# Patient Record
Sex: Male | Born: 1935 | Race: White | Hispanic: No | Smoking: Former smoker
Health system: Southern US, Community
[De-identification: ages and names within clinical notes are randomized; demographics above are authoritative.]

---

## 2014-03-06 DIAGNOSIS — N401 Enlarged prostate with lower urinary tract symptoms: Secondary | ICD-10-CM | POA: Diagnosis not present

## 2014-03-06 DIAGNOSIS — R339 Retention of urine, unspecified: Secondary | ICD-10-CM | POA: Diagnosis not present

## 2014-04-03 DIAGNOSIS — M25511 Pain in right shoulder: Secondary | ICD-10-CM | POA: Diagnosis not present

## 2014-04-03 DIAGNOSIS — Z79899 Other long term (current) drug therapy: Secondary | ICD-10-CM | POA: Diagnosis not present

## 2014-04-03 DIAGNOSIS — Z6830 Body mass index (BMI) 30.0-30.9, adult: Secondary | ICD-10-CM | POA: Diagnosis not present

## 2014-04-03 DIAGNOSIS — E785 Hyperlipidemia, unspecified: Secondary | ICD-10-CM | POA: Diagnosis not present

## 2014-04-03 DIAGNOSIS — Z0000 Encounter for general adult medical examination without abnormal findings: Secondary | ICD-10-CM | POA: Diagnosis not present

## 2014-04-03 DIAGNOSIS — N401 Enlarged prostate with lower urinary tract symptoms: Secondary | ICD-10-CM | POA: Diagnosis not present

## 2014-04-03 DIAGNOSIS — Z125 Encounter for screening for malignant neoplasm of prostate: Secondary | ICD-10-CM | POA: Diagnosis not present

## 2014-04-03 DIAGNOSIS — Z136 Encounter for screening for cardiovascular disorders: Secondary | ICD-10-CM | POA: Diagnosis not present

## 2014-04-03 DIAGNOSIS — Z1211 Encounter for screening for malignant neoplasm of colon: Secondary | ICD-10-CM | POA: Diagnosis not present

## 2014-04-03 DIAGNOSIS — E038 Other specified hypothyroidism: Secondary | ICD-10-CM | POA: Diagnosis not present

## 2014-04-03 DIAGNOSIS — M25512 Pain in left shoulder: Secondary | ICD-10-CM | POA: Diagnosis not present

## 2014-04-03 DIAGNOSIS — I10 Essential (primary) hypertension: Secondary | ICD-10-CM | POA: Diagnosis not present

## 2014-04-16 DIAGNOSIS — Z1211 Encounter for screening for malignant neoplasm of colon: Secondary | ICD-10-CM | POA: Diagnosis not present

## 2014-05-02 DIAGNOSIS — N401 Enlarged prostate with lower urinary tract symptoms: Secondary | ICD-10-CM | POA: Diagnosis not present

## 2014-05-02 DIAGNOSIS — Z79899 Other long term (current) drug therapy: Secondary | ICD-10-CM | POA: Diagnosis not present

## 2014-05-02 DIAGNOSIS — I10 Essential (primary) hypertension: Secondary | ICD-10-CM | POA: Diagnosis not present

## 2014-06-04 DIAGNOSIS — N401 Enlarged prostate with lower urinary tract symptoms: Secondary | ICD-10-CM | POA: Diagnosis not present

## 2014-06-04 DIAGNOSIS — Z79899 Other long term (current) drug therapy: Secondary | ICD-10-CM | POA: Diagnosis not present

## 2014-06-04 DIAGNOSIS — I10 Essential (primary) hypertension: Secondary | ICD-10-CM | POA: Diagnosis not present

## 2014-06-04 DIAGNOSIS — R58 Hemorrhage, not elsewhere classified: Secondary | ICD-10-CM | POA: Diagnosis not present

## 2014-06-05 DIAGNOSIS — N401 Enlarged prostate with lower urinary tract symptoms: Secondary | ICD-10-CM | POA: Diagnosis not present

## 2014-06-05 DIAGNOSIS — R972 Elevated prostate specific antigen [PSA]: Secondary | ICD-10-CM | POA: Diagnosis not present

## 2014-06-27 DIAGNOSIS — K120 Recurrent oral aphthae: Secondary | ICD-10-CM | POA: Diagnosis not present

## 2014-09-10 DIAGNOSIS — R339 Retention of urine, unspecified: Secondary | ICD-10-CM | POA: Diagnosis not present

## 2014-09-10 DIAGNOSIS — N401 Enlarged prostate with lower urinary tract symptoms: Secondary | ICD-10-CM | POA: Diagnosis not present

## 2014-09-10 DIAGNOSIS — R972 Elevated prostate specific antigen [PSA]: Secondary | ICD-10-CM | POA: Diagnosis not present

## 2014-10-02 DIAGNOSIS — N401 Enlarged prostate with lower urinary tract symptoms: Secondary | ICD-10-CM | POA: Diagnosis not present

## 2014-10-02 DIAGNOSIS — I10 Essential (primary) hypertension: Secondary | ICD-10-CM | POA: Diagnosis not present

## 2014-10-02 DIAGNOSIS — E785 Hyperlipidemia, unspecified: Secondary | ICD-10-CM | POA: Diagnosis not present

## 2014-10-02 DIAGNOSIS — L219 Seborrheic dermatitis, unspecified: Secondary | ICD-10-CM | POA: Diagnosis not present

## 2014-10-02 DIAGNOSIS — E038 Other specified hypothyroidism: Secondary | ICD-10-CM | POA: Diagnosis not present

## 2014-10-02 DIAGNOSIS — Z79899 Other long term (current) drug therapy: Secondary | ICD-10-CM | POA: Diagnosis not present

## 2014-12-17 DIAGNOSIS — Z23 Encounter for immunization: Secondary | ICD-10-CM | POA: Diagnosis not present

## 2014-12-18 DIAGNOSIS — R972 Elevated prostate specific antigen [PSA]: Secondary | ICD-10-CM | POA: Diagnosis not present

## 2014-12-18 DIAGNOSIS — N401 Enlarged prostate with lower urinary tract symptoms: Secondary | ICD-10-CM | POA: Diagnosis not present

## 2014-12-18 DIAGNOSIS — R339 Retention of urine, unspecified: Secondary | ICD-10-CM | POA: Diagnosis not present

## 2015-03-18 DIAGNOSIS — Z961 Presence of intraocular lens: Secondary | ICD-10-CM | POA: Diagnosis not present

## 2015-03-18 DIAGNOSIS — H18412 Arcus senilis, left eye: Secondary | ICD-10-CM | POA: Diagnosis not present

## 2015-03-18 DIAGNOSIS — H18411 Arcus senilis, right eye: Secondary | ICD-10-CM | POA: Diagnosis not present

## 2015-03-18 DIAGNOSIS — H2512 Age-related nuclear cataract, left eye: Secondary | ICD-10-CM | POA: Diagnosis not present

## 2015-03-18 DIAGNOSIS — H02839 Dermatochalasis of unspecified eye, unspecified eyelid: Secondary | ICD-10-CM | POA: Diagnosis not present

## 2015-03-24 DIAGNOSIS — H25812 Combined forms of age-related cataract, left eye: Secondary | ICD-10-CM | POA: Diagnosis not present

## 2015-03-24 DIAGNOSIS — H2512 Age-related nuclear cataract, left eye: Secondary | ICD-10-CM | POA: Diagnosis not present

## 2015-04-16 DIAGNOSIS — R339 Retention of urine, unspecified: Secondary | ICD-10-CM | POA: Diagnosis not present

## 2015-04-16 DIAGNOSIS — N401 Enlarged prostate with lower urinary tract symptoms: Secondary | ICD-10-CM | POA: Diagnosis not present

## 2015-04-16 DIAGNOSIS — R972 Elevated prostate specific antigen [PSA]: Secondary | ICD-10-CM | POA: Diagnosis not present

## 2015-08-13 DIAGNOSIS — Z136 Encounter for screening for cardiovascular disorders: Secondary | ICD-10-CM | POA: Diagnosis not present

## 2015-08-13 DIAGNOSIS — Z79899 Other long term (current) drug therapy: Secondary | ICD-10-CM | POA: Diagnosis not present

## 2015-08-13 DIAGNOSIS — E669 Obesity, unspecified: Secondary | ICD-10-CM | POA: Diagnosis not present

## 2015-08-13 DIAGNOSIS — Z125 Encounter for screening for malignant neoplasm of prostate: Secondary | ICD-10-CM | POA: Diagnosis not present

## 2015-08-13 DIAGNOSIS — I10 Essential (primary) hypertension: Secondary | ICD-10-CM | POA: Diagnosis not present

## 2015-08-13 DIAGNOSIS — Z9181 History of falling: Secondary | ICD-10-CM | POA: Diagnosis not present

## 2015-08-13 DIAGNOSIS — Z1211 Encounter for screening for malignant neoplasm of colon: Secondary | ICD-10-CM | POA: Diagnosis not present

## 2015-08-13 DIAGNOSIS — Z1389 Encounter for screening for other disorder: Secondary | ICD-10-CM | POA: Diagnosis not present

## 2015-08-13 DIAGNOSIS — E782 Mixed hyperlipidemia: Secondary | ICD-10-CM | POA: Diagnosis not present

## 2015-08-13 DIAGNOSIS — Z0000 Encounter for general adult medical examination without abnormal findings: Secondary | ICD-10-CM | POA: Diagnosis not present

## 2015-08-13 DIAGNOSIS — E039 Hypothyroidism, unspecified: Secondary | ICD-10-CM | POA: Diagnosis not present

## 2015-08-13 DIAGNOSIS — N401 Enlarged prostate with lower urinary tract symptoms: Secondary | ICD-10-CM | POA: Diagnosis not present

## 2015-08-13 DIAGNOSIS — R011 Cardiac murmur, unspecified: Secondary | ICD-10-CM | POA: Diagnosis not present

## 2015-08-15 DIAGNOSIS — N401 Enlarged prostate with lower urinary tract symptoms: Secondary | ICD-10-CM | POA: Diagnosis not present

## 2015-08-15 DIAGNOSIS — R339 Retention of urine, unspecified: Secondary | ICD-10-CM | POA: Diagnosis not present

## 2015-08-18 DIAGNOSIS — R011 Cardiac murmur, unspecified: Secondary | ICD-10-CM | POA: Diagnosis not present

## 2015-08-18 DIAGNOSIS — I350 Nonrheumatic aortic (valve) stenosis: Secondary | ICD-10-CM | POA: Diagnosis not present

## 2015-08-18 DIAGNOSIS — I082 Rheumatic disorders of both aortic and tricuspid valves: Secondary | ICD-10-CM | POA: Diagnosis not present

## 2015-11-18 DIAGNOSIS — Z23 Encounter for immunization: Secondary | ICD-10-CM | POA: Diagnosis not present

## 2015-12-15 DIAGNOSIS — R339 Retention of urine, unspecified: Secondary | ICD-10-CM | POA: Diagnosis not present

## 2015-12-15 DIAGNOSIS — R972 Elevated prostate specific antigen [PSA]: Secondary | ICD-10-CM | POA: Diagnosis not present

## 2015-12-15 DIAGNOSIS — N401 Enlarged prostate with lower urinary tract symptoms: Secondary | ICD-10-CM | POA: Diagnosis not present

## 2016-02-17 DIAGNOSIS — E782 Mixed hyperlipidemia: Secondary | ICD-10-CM | POA: Diagnosis not present

## 2016-02-17 DIAGNOSIS — I10 Essential (primary) hypertension: Secondary | ICD-10-CM | POA: Diagnosis not present

## 2016-02-17 DIAGNOSIS — F439 Reaction to severe stress, unspecified: Secondary | ICD-10-CM | POA: Diagnosis not present

## 2016-02-17 DIAGNOSIS — Z79899 Other long term (current) drug therapy: Secondary | ICD-10-CM | POA: Diagnosis not present

## 2016-02-17 DIAGNOSIS — E039 Hypothyroidism, unspecified: Secondary | ICD-10-CM | POA: Diagnosis not present

## 2016-02-17 DIAGNOSIS — R42 Dizziness and giddiness: Secondary | ICD-10-CM | POA: Diagnosis not present

## 2016-03-30 DIAGNOSIS — Z79899 Other long term (current) drug therapy: Secondary | ICD-10-CM | POA: Diagnosis not present

## 2016-03-30 DIAGNOSIS — F419 Anxiety disorder, unspecified: Secondary | ICD-10-CM | POA: Diagnosis not present

## 2016-03-30 DIAGNOSIS — R42 Dizziness and giddiness: Secondary | ICD-10-CM | POA: Diagnosis not present

## 2016-03-30 DIAGNOSIS — R51 Headache: Secondary | ICD-10-CM | POA: Diagnosis not present

## 2016-04-20 DIAGNOSIS — N401 Enlarged prostate with lower urinary tract symptoms: Secondary | ICD-10-CM | POA: Diagnosis not present

## 2016-04-20 DIAGNOSIS — R339 Retention of urine, unspecified: Secondary | ICD-10-CM | POA: Diagnosis not present

## 2016-04-20 DIAGNOSIS — R972 Elevated prostate specific antigen [PSA]: Secondary | ICD-10-CM | POA: Diagnosis not present

## 2016-08-18 DIAGNOSIS — R972 Elevated prostate specific antigen [PSA]: Secondary | ICD-10-CM | POA: Diagnosis not present

## 2016-08-18 DIAGNOSIS — N401 Enlarged prostate with lower urinary tract symptoms: Secondary | ICD-10-CM | POA: Diagnosis not present

## 2016-09-23 DIAGNOSIS — I10 Essential (primary) hypertension: Secondary | ICD-10-CM | POA: Diagnosis not present

## 2016-09-23 DIAGNOSIS — E785 Hyperlipidemia, unspecified: Secondary | ICD-10-CM | POA: Diagnosis not present

## 2016-09-23 DIAGNOSIS — Z1211 Encounter for screening for malignant neoplasm of colon: Secondary | ICD-10-CM | POA: Diagnosis not present

## 2016-09-23 DIAGNOSIS — Z0000 Encounter for general adult medical examination without abnormal findings: Secondary | ICD-10-CM | POA: Diagnosis not present

## 2016-09-23 DIAGNOSIS — Z136 Encounter for screening for cardiovascular disorders: Secondary | ICD-10-CM | POA: Diagnosis not present

## 2016-10-11 DIAGNOSIS — E039 Hypothyroidism, unspecified: Secondary | ICD-10-CM | POA: Diagnosis not present

## 2016-10-11 DIAGNOSIS — Z136 Encounter for screening for cardiovascular disorders: Secondary | ICD-10-CM | POA: Diagnosis not present

## 2016-10-11 DIAGNOSIS — Z79899 Other long term (current) drug therapy: Secondary | ICD-10-CM | POA: Diagnosis not present

## 2016-10-11 DIAGNOSIS — I10 Essential (primary) hypertension: Secondary | ICD-10-CM | POA: Diagnosis not present

## 2016-10-11 DIAGNOSIS — E782 Mixed hyperlipidemia: Secondary | ICD-10-CM | POA: Diagnosis not present

## 2016-11-30 DIAGNOSIS — Z23 Encounter for immunization: Secondary | ICD-10-CM | POA: Diagnosis not present

## 2016-12-21 DIAGNOSIS — R972 Elevated prostate specific antigen [PSA]: Secondary | ICD-10-CM | POA: Diagnosis not present

## 2016-12-21 DIAGNOSIS — R339 Retention of urine, unspecified: Secondary | ICD-10-CM | POA: Diagnosis not present

## 2016-12-21 DIAGNOSIS — N401 Enlarged prostate with lower urinary tract symptoms: Secondary | ICD-10-CM | POA: Diagnosis not present

## 2017-04-13 DIAGNOSIS — S6992XA Unspecified injury of left wrist, hand and finger(s), initial encounter: Secondary | ICD-10-CM | POA: Diagnosis not present

## 2017-04-13 DIAGNOSIS — I10 Essential (primary) hypertension: Secondary | ICD-10-CM | POA: Diagnosis not present

## 2017-04-13 DIAGNOSIS — Z79899 Other long term (current) drug therapy: Secondary | ICD-10-CM | POA: Diagnosis not present

## 2017-04-13 DIAGNOSIS — D692 Other nonthrombocytopenic purpura: Secondary | ICD-10-CM | POA: Diagnosis not present

## 2017-04-13 DIAGNOSIS — E039 Hypothyroidism, unspecified: Secondary | ICD-10-CM | POA: Diagnosis not present

## 2017-04-13 DIAGNOSIS — E782 Mixed hyperlipidemia: Secondary | ICD-10-CM | POA: Diagnosis not present

## 2017-04-28 DIAGNOSIS — R972 Elevated prostate specific antigen [PSA]: Secondary | ICD-10-CM | POA: Diagnosis not present

## 2017-04-28 DIAGNOSIS — N401 Enlarged prostate with lower urinary tract symptoms: Secondary | ICD-10-CM | POA: Diagnosis not present

## 2017-04-28 DIAGNOSIS — R339 Retention of urine, unspecified: Secondary | ICD-10-CM | POA: Diagnosis not present

## 2017-07-25 DIAGNOSIS — E039 Hypothyroidism, unspecified: Secondary | ICD-10-CM | POA: Diagnosis not present

## 2017-07-25 DIAGNOSIS — I10 Essential (primary) hypertension: Secondary | ICD-10-CM | POA: Diagnosis not present

## 2017-07-25 DIAGNOSIS — R531 Weakness: Secondary | ICD-10-CM | POA: Diagnosis not present

## 2017-07-25 DIAGNOSIS — E785 Hyperlipidemia, unspecified: Secondary | ICD-10-CM | POA: Diagnosis not present

## 2017-07-25 DIAGNOSIS — R42 Dizziness and giddiness: Secondary | ICD-10-CM | POA: Diagnosis not present

## 2017-08-02 DIAGNOSIS — R531 Weakness: Secondary | ICD-10-CM | POA: Diagnosis not present

## 2017-08-02 DIAGNOSIS — R42 Dizziness and giddiness: Secondary | ICD-10-CM | POA: Diagnosis not present

## 2017-08-02 DIAGNOSIS — I350 Nonrheumatic aortic (valve) stenosis: Secondary | ICD-10-CM | POA: Diagnosis not present

## 2017-08-02 DIAGNOSIS — D696 Thrombocytopenia, unspecified: Secondary | ICD-10-CM | POA: Diagnosis not present

## 2017-08-29 DIAGNOSIS — N401 Enlarged prostate with lower urinary tract symptoms: Secondary | ICD-10-CM | POA: Diagnosis not present

## 2017-08-29 DIAGNOSIS — R972 Elevated prostate specific antigen [PSA]: Secondary | ICD-10-CM | POA: Diagnosis not present

## 2017-10-18 DIAGNOSIS — Z9181 History of falling: Secondary | ICD-10-CM | POA: Diagnosis not present

## 2017-10-18 DIAGNOSIS — Z0000 Encounter for general adult medical examination without abnormal findings: Secondary | ICD-10-CM | POA: Diagnosis not present

## 2017-10-18 DIAGNOSIS — Z136 Encounter for screening for cardiovascular disorders: Secondary | ICD-10-CM | POA: Diagnosis not present

## 2017-10-18 DIAGNOSIS — Z1331 Encounter for screening for depression: Secondary | ICD-10-CM | POA: Diagnosis not present

## 2017-10-18 DIAGNOSIS — E785 Hyperlipidemia, unspecified: Secondary | ICD-10-CM | POA: Diagnosis not present

## 2017-10-18 DIAGNOSIS — Z23 Encounter for immunization: Secondary | ICD-10-CM | POA: Diagnosis not present

## 2017-10-18 DIAGNOSIS — Z125 Encounter for screening for malignant neoplasm of prostate: Secondary | ICD-10-CM | POA: Diagnosis not present

## 2017-12-23 DIAGNOSIS — Z23 Encounter for immunization: Secondary | ICD-10-CM | POA: Diagnosis not present

## 2018-01-04 DIAGNOSIS — N401 Enlarged prostate with lower urinary tract symptoms: Secondary | ICD-10-CM | POA: Diagnosis not present

## 2018-01-04 DIAGNOSIS — R972 Elevated prostate specific antigen [PSA]: Secondary | ICD-10-CM | POA: Diagnosis not present

## 2018-05-05 DIAGNOSIS — N401 Enlarged prostate with lower urinary tract symptoms: Secondary | ICD-10-CM | POA: Diagnosis not present

## 2018-05-05 DIAGNOSIS — R339 Retention of urine, unspecified: Secondary | ICD-10-CM | POA: Diagnosis not present

## 2018-06-07 DIAGNOSIS — E039 Hypothyroidism, unspecified: Secondary | ICD-10-CM | POA: Diagnosis not present

## 2018-06-07 DIAGNOSIS — I10 Essential (primary) hypertension: Secondary | ICD-10-CM | POA: Diagnosis not present

## 2018-06-07 DIAGNOSIS — E785 Hyperlipidemia, unspecified: Secondary | ICD-10-CM | POA: Diagnosis not present

## 2018-06-07 DIAGNOSIS — Z79899 Other long term (current) drug therapy: Secondary | ICD-10-CM | POA: Diagnosis not present

## 2018-06-13 DIAGNOSIS — E785 Hyperlipidemia, unspecified: Secondary | ICD-10-CM | POA: Diagnosis not present

## 2018-06-13 DIAGNOSIS — E039 Hypothyroidism, unspecified: Secondary | ICD-10-CM | POA: Diagnosis not present

## 2018-09-04 DIAGNOSIS — R972 Elevated prostate specific antigen [PSA]: Secondary | ICD-10-CM | POA: Diagnosis not present

## 2018-09-04 DIAGNOSIS — R339 Retention of urine, unspecified: Secondary | ICD-10-CM | POA: Diagnosis not present

## 2018-09-04 DIAGNOSIS — N401 Enlarged prostate with lower urinary tract symptoms: Secondary | ICD-10-CM | POA: Diagnosis not present

## 2018-10-23 DIAGNOSIS — Z9181 History of falling: Secondary | ICD-10-CM | POA: Diagnosis not present

## 2018-10-23 DIAGNOSIS — Z139 Encounter for screening, unspecified: Secondary | ICD-10-CM | POA: Diagnosis not present

## 2018-10-23 DIAGNOSIS — Z0000 Encounter for general adult medical examination without abnormal findings: Secondary | ICD-10-CM | POA: Diagnosis not present

## 2018-10-23 DIAGNOSIS — Z1331 Encounter for screening for depression: Secondary | ICD-10-CM | POA: Diagnosis not present

## 2018-10-23 DIAGNOSIS — E785 Hyperlipidemia, unspecified: Secondary | ICD-10-CM | POA: Diagnosis not present

## 2018-12-26 DIAGNOSIS — Z23 Encounter for immunization: Secondary | ICD-10-CM | POA: Diagnosis not present

## 2019-01-08 DIAGNOSIS — Z20828 Contact with and (suspected) exposure to other viral communicable diseases: Secondary | ICD-10-CM | POA: Diagnosis not present

## 2019-01-08 DIAGNOSIS — R05 Cough: Secondary | ICD-10-CM | POA: Diagnosis not present

## 2019-01-09 ENCOUNTER — Other Ambulatory Visit: Payer: Self-pay

## 2019-01-09 ENCOUNTER — Inpatient Hospital Stay (HOSPITAL_COMMUNITY)
Admission: AD | Admit: 2019-01-09 | Discharge: 2019-01-23 | DRG: 208 | Disposition: E | Payer: Medicare Other | Source: Other Acute Inpatient Hospital | Attending: Pulmonary Disease | Admitting: Pulmonary Disease

## 2019-01-09 ENCOUNTER — Encounter (HOSPITAL_COMMUNITY): Payer: Self-pay

## 2019-01-09 DIAGNOSIS — W06XXXA Fall from bed, initial encounter: Secondary | ICD-10-CM | POA: Diagnosis not present

## 2019-01-09 DIAGNOSIS — L89221 Pressure ulcer of left hip, stage 1: Secondary | ICD-10-CM | POA: Diagnosis present

## 2019-01-09 DIAGNOSIS — Z515 Encounter for palliative care: Secondary | ICD-10-CM | POA: Diagnosis present

## 2019-01-09 DIAGNOSIS — D6959 Other secondary thrombocytopenia: Secondary | ICD-10-CM | POA: Diagnosis present

## 2019-01-09 DIAGNOSIS — I824Z3 Acute embolism and thrombosis of unspecified deep veins of distal lower extremity, bilateral: Secondary | ICD-10-CM | POA: Diagnosis present

## 2019-01-09 DIAGNOSIS — Z79890 Hormone replacement therapy (postmenopausal): Secondary | ICD-10-CM

## 2019-01-09 DIAGNOSIS — J1289 Other viral pneumonia: Secondary | ICD-10-CM | POA: Diagnosis present

## 2019-01-09 DIAGNOSIS — J80 Acute respiratory distress syndrome: Secondary | ICD-10-CM | POA: Diagnosis not present

## 2019-01-09 DIAGNOSIS — Z87891 Personal history of nicotine dependence: Secondary | ICD-10-CM

## 2019-01-09 DIAGNOSIS — J9600 Acute respiratory failure, unspecified whether with hypoxia or hypercapnia: Secondary | ICD-10-CM | POA: Diagnosis not present

## 2019-01-09 DIAGNOSIS — E039 Hypothyroidism, unspecified: Secondary | ICD-10-CM | POA: Diagnosis present

## 2019-01-09 DIAGNOSIS — J9601 Acute respiratory failure with hypoxia: Secondary | ICD-10-CM | POA: Diagnosis present

## 2019-01-09 DIAGNOSIS — R0902 Hypoxemia: Secondary | ICD-10-CM

## 2019-01-09 DIAGNOSIS — I10 Essential (primary) hypertension: Secondary | ICD-10-CM | POA: Diagnosis not present

## 2019-01-09 DIAGNOSIS — J9383 Other pneumothorax: Secondary | ICD-10-CM | POA: Diagnosis not present

## 2019-01-09 DIAGNOSIS — R778 Other specified abnormalities of plasma proteins: Secondary | ICD-10-CM | POA: Diagnosis not present

## 2019-01-09 DIAGNOSIS — Z66 Do not resuscitate: Secondary | ICD-10-CM | POA: Diagnosis present

## 2019-01-09 DIAGNOSIS — Y92230 Patient room in hospital as the place of occurrence of the external cause: Secondary | ICD-10-CM | POA: Diagnosis not present

## 2019-01-09 DIAGNOSIS — J9690 Respiratory failure, unspecified, unspecified whether with hypoxia or hypercapnia: Secondary | ICD-10-CM | POA: Diagnosis not present

## 2019-01-09 DIAGNOSIS — R7989 Other specified abnormal findings of blood chemistry: Secondary | ICD-10-CM | POA: Diagnosis not present

## 2019-01-09 DIAGNOSIS — I82443 Acute embolism and thrombosis of tibial vein, bilateral: Secondary | ICD-10-CM | POA: Diagnosis present

## 2019-01-09 DIAGNOSIS — J1282 Pneumonia due to coronavirus disease 2019: Secondary | ICD-10-CM | POA: Diagnosis present

## 2019-01-09 DIAGNOSIS — U071 COVID-19: Secondary | ICD-10-CM | POA: Diagnosis present

## 2019-01-09 DIAGNOSIS — J189 Pneumonia, unspecified organism: Secondary | ICD-10-CM | POA: Diagnosis not present

## 2019-01-09 DIAGNOSIS — Z01818 Encounter for other preprocedural examination: Secondary | ICD-10-CM

## 2019-01-09 DIAGNOSIS — R079 Chest pain, unspecified: Secondary | ICD-10-CM | POA: Diagnosis not present

## 2019-01-09 DIAGNOSIS — E785 Hyperlipidemia, unspecified: Secondary | ICD-10-CM | POA: Diagnosis present

## 2019-01-09 DIAGNOSIS — N179 Acute kidney failure, unspecified: Secondary | ICD-10-CM | POA: Diagnosis not present

## 2019-01-09 DIAGNOSIS — L8990 Pressure ulcer of unspecified site, unspecified stage: Secondary | ICD-10-CM | POA: Insufficient documentation

## 2019-01-09 DIAGNOSIS — N400 Enlarged prostate without lower urinary tract symptoms: Secondary | ICD-10-CM | POA: Diagnosis not present

## 2019-01-09 MED ORDER — DEXAMETHASONE SODIUM PHOSPHATE 10 MG/ML IJ SOLN
6.0000 mg | INTRAMUSCULAR | Status: DC
  Administered 2019-01-09 – 2019-01-15 (×7): 6 mg via INTRAVENOUS
  Filled 2019-01-09 (×7): qty 1

## 2019-01-09 MED ORDER — ONDANSETRON HCL 4 MG PO TABS: 4.0000 mg | ORAL_TABLET | Freq: Four times a day (QID) | ORAL | Status: DC | PRN

## 2019-01-09 MED ORDER — ZINC SULFATE 220 (50 ZN) MG PO CAPS
220.0000 mg | ORAL_CAPSULE | Freq: Every day | ORAL | Status: DC
  Administered 2019-01-09 – 2019-01-15 (×7): 220 mg via ORAL
  Filled 2019-01-09 (×8): qty 1

## 2019-01-09 MED ORDER — SODIUM CHLORIDE 0.9 % IV SOLN
100.0000 mg | INTRAVENOUS | Status: AC
  Administered 2019-01-10 – 2019-01-13 (×4): 100 mg via INTRAVENOUS
  Filled 2019-01-09: qty 20
  Filled 2019-01-09 (×3): qty 100

## 2019-01-09 MED ORDER — SODIUM CHLORIDE 0.9 % IV SOLN
250.0000 mL | INTRAVENOUS | Status: DC | PRN
  Administered 2019-01-14: 10 mL via INTRAVENOUS

## 2019-01-09 MED ORDER — HYDROCOD POLST-CPM POLST ER 10-8 MG/5ML PO SUER
5.0000 mL | Freq: Two times a day (BID) | ORAL | Status: DC | PRN
  Administered 2019-01-09 – 2019-01-13 (×5): 5 mL via ORAL
  Filled 2019-01-09 (×6): qty 5

## 2019-01-09 MED ORDER — VITAMIN C 500 MG PO TABS
500.0000 mg | ORAL_TABLET | Freq: Every day | ORAL | Status: DC
  Administered 2019-01-09 – 2019-01-15 (×7): 500 mg via ORAL
  Filled 2019-01-09 (×8): qty 1

## 2019-01-09 MED ORDER — GUAIFENESIN-DM 100-10 MG/5ML PO SYRP
10.0000 mL | ORAL_SOLUTION | ORAL | Status: DC | PRN
  Administered 2019-01-11 – 2019-01-12 (×5): 10 mL via ORAL
  Filled 2019-01-09 (×4): qty 10

## 2019-01-09 MED ORDER — ACETAMINOPHEN 325 MG PO TABS
650.0000 mg | ORAL_TABLET | Freq: Four times a day (QID) | ORAL | Status: DC | PRN
  Administered 2019-01-13 – 2019-01-16 (×2): 650 mg via ORAL
  Filled 2019-01-09 (×2): qty 2

## 2019-01-09 MED ORDER — ENOXAPARIN SODIUM 40 MG/0.4ML ~~LOC~~ SOLN
40.0000 mg | SUBCUTANEOUS | Status: DC
  Administered 2019-01-09 – 2019-01-11 (×3): 40 mg via SUBCUTANEOUS
  Filled 2019-01-09 (×3): qty 0.4

## 2019-01-09 MED ORDER — SODIUM CHLORIDE 0.9% FLUSH
3.0000 mL | Freq: Two times a day (BID) | INTRAVENOUS | Status: DC
  Administered 2019-01-09 – 2019-01-16 (×13): 3 mL via INTRAVENOUS

## 2019-01-09 MED ORDER — SODIUM CHLORIDE 0.9% FLUSH: 3.0000 mL | INTRAVENOUS | Status: DC | PRN

## 2019-01-09 MED ORDER — ONDANSETRON HCL 4 MG/2ML IJ SOLN: 4.0000 mg | Freq: Four times a day (QID) | INTRAMUSCULAR | Status: DC | PRN

## 2019-01-09 NOTE — Progress Notes (Signed)
MEDICATION RELATED CONSULT NOTE - INITIAL   Pharmacy Consult for Remdesevir  Indication: COVID  Assessment: Patient just admitted at [facility], pending labs and physician work up.  [facility] and confirmed that he transferred from there. Most recent Scr was 0.7, ALT 51.   Remdesevir 200mg  IV was given at [facility] 11/17 @16 :55 Dexamethasone 10mg  IV was given at [facility] 11/17 @15 :40 No DVT prophylaxis was given at [place]:  Start remdesevir 100mg  tomorrow for 4 doses   Benetta Spar, PharmD, BCPS, [place] Clinical Pharmacist  Please check AMION for all [place] phone numbers After 10:00 PM, call [place] [phone]

## 2019-01-09 NOTE — H&P (Signed)
History and Physical    Anthony Carey ATF:[id number] DOB: 1930-03-08 DOA: 01/06/2019  PCP: No primary care provider on file.  Patient coming from:  home  Chief Complaint:   Fatigue and sob, fever  HPI: Anthony Carey is a 85 y.o. male with medical history significant of hld, hypothryoid comes in with several days of worsening fatigue, sob and cough.  80s on RA on arrival, better now on 4 liters [place].  cxr with covid pna, covid pos.  Vitals stable.  Denies cp, abd pain n/v/d.  Referred for admission for covid pna causing hypoxia.  Review of Systems: As per HPI otherwise 10 point review of systems negative.   History reviewed. No pertinent past medical history. as above   reports that he quit smoking about 35 years ago. His smoking use included cigarettes. He has never used smokeless tobacco. He reports that he does not drink alcohol or use drugs. neg x 3  Allergies  Allergen Reactions  . Avodart [Dutasteride] Other (See Comments)    Joint pain  . Contrast Media [Iodinated Diagnostic Agents] Other (See Comments)    Severe weakness and fatigue  . Lipitor [Atorvastatin] Other (See Comments)    Joint pain, weakness    History reviewed. No pertinent family history. no premature CAD  Prior to Admission medications   Not on File  unknown  Physical Exam: Vitals:   01/22/2019 2002  BP: 121/64  Pulse: 80  Resp: 20  Temp: 98.2 F (36.8 C)  TempSrc: Oral  SpO2: 93%  Weight: 81.6 kg      Constitutional: NAD, calm, comfortable Vitals:   12/24/2018 2002  BP: 121/64  Pulse: 80  Resp: 20  Temp: 98.2 F (36.8 C)  TempSrc: Oral  SpO2: 93%  Weight: 81.6 kg   Eyes: PERRL, lids and conjunctivae normal ENMT: Mucous membranes are moist. Posterior pharynx clear of any exudate or lesions.Normal dentition.  Neck: normal, supple, no masses, no thyromegaly Respiratory: clear to auscultation bilaterally, no wheezing, no crackles. Normal respiratory effort. No accessory muscle use.   Cardiovascular: Regular rate and rhythm, no murmurs / rubs / gallops. No extremity edema. 2+ pedal pulses. No carotid bruits.  Abdomen: no tenderness, no masses palpated. No hepatosplenomegaly. Bowel sounds positive.  Musculoskeletal: no clubbing / cyanosis. No joint deformity upper and lower extremities. Good ROM, no contractures. Normal muscle tone.  Skin: no rashes, lesions, ulcers. No induration Neurologic: CN 2-12 grossly intact. Sensation intact, DTR normal. Strength 5/5 in all 4.  Psychiatric: Normal judgment and insight. Alert and oriented x 3. Normal mood.    Labs on Admission: I have personally reviewed following labs and imaging studies  CBC: No results for input(s): WBC, NEUTROABS, HGB, HCT, MCV, PLT in the last 168 hours. Basic Metabolic Panel: No results for input(s): NA, K, CL, CO2, GLUCOSE, BUN, CREATININE, CALCIUM, MG, PHOS in the last 168 hours. GFR: CrCl cannot be calculated (No successful lab value found.). Liver Function Tests: No results for input(s): AST, ALT, ALKPHOS, BILITOT, PROT, ALBUMIN in the last 168 hours. No results for input(s): LIPASE, AMYLASE in the last 168 hours. No results for input(s): AMMONIA in the last 168 hours. Coagulation Profile: No results for input(s): INR, PROTIME in the last 168 hours. Cardiac Enzymes: No results for input(s): CKTOTAL, CKMB, CKMBINDEX, TROPONINI in the last 168 hours. BNP (last 3 results) No results for input(s): PROBNP in the last 8760 hours. HbA1C: No results for input(s): HGBA1C in the last 72 hours. CBG: No results for input(s):  GLUCAP in the last 168 hours. Lipid Profile: No results for input(s): CHOL, HDL, LDLCALC, TRIG, CHOLHDL, LDLDIRECT in the last 72 hours. Thyroid Function Tests: No results for input(s): TSH, T4TOTAL, FREET4, T3FREE, THYROIDAB in the last 72 hours. Anemia Panel: No results for input(s): VITAMINB12, FOLATE, FERRITIN, TIBC, IRON, RETICCTPCT in the last 72 hours. Urine analysis: No  results found for: COLORURINE, APPEARANCEUR, LABSPEC, PHURINE, GLUCOSEU, HGBUR, BILIRUBINUR, KETONESUR, PROTEINUR, UROBILINOGEN, NITRITE, LEUKOCYTESUR Sepsis Labs: !!!!!!!!!!!!!!!!!!!!!!!!!!!!!!!!!!!!!!!!!!!! @LABRCNTIP (procalcitonin:4,lacticidven:4) )No results found for this or any previous visit (from the past 240 hour(s)).   Radiological Exams on Admission: No results found.  Chart reviewed from [place].  crp over 200.  Cr 0.7.  Wbc 4.8.  lfts nml.  cxr report with multifocal opacities  Assessment/Plan 85 yo male with bilateral covid pna  Principal Problem:   Pneumonia due to COVID-19 virus- iv decadron and remdisivir.  Pt agreeable to these meds.  Vit c/zinc and lovenox ordered.  Daily labs ordered and inflammatory markers will be followed.  Dimer pending.  Wean o2 as tolerates.  Mentation clear at this time  Active Problems:   Acute respiratory failure due to COVID-19 [facility])- as above, currently on 4 liters [place]   HLD (hyperlipidemia)- resume home meds once clarified   Hypothyroidism- same    DVT prophylaxis: lovenox Code Status:  full Family Communication: none Disposition Plan:  days Consults called:  none Admission status:  admission   Miking Usrey A MD [facility]  If 7PM-7AM, please contact night-coverage [email or web address] Password [facility]  01/08/2019, 11:33 PM

## 2019-01-10 LAB — CBC WITH DIFFERENTIAL/PLATELET
Abs Immature Granulocytes: 0.04 10*3/uL (ref 0.00–0.07)
Basophils Absolute: 0 10*3/uL (ref 0.0–0.1)
Basophils Relative: 0 %
Eosinophils Absolute: 0 10*3/uL (ref 0.0–0.5)
Eosinophils Relative: 0 %
HCT: 38.6 % — ABNORMAL LOW (ref 39.0–52.0)
Hemoglobin: 12.8 g/dL — ABNORMAL LOW (ref 13.0–17.0)
Immature Granulocytes: 1 %
Lymphocytes Relative: 4 %
Lymphs Abs: 0.2 10*3/uL — ABNORMAL LOW (ref 0.7–4.0)
MCH: 32.5 pg (ref 26.0–34.0)
MCHC: 33.2 g/dL (ref 30.0–36.0)
MCV: 98 fL (ref 80.0–100.0)
Monocytes Absolute: 0.2 10*3/uL (ref 0.1–1.0)
Monocytes Relative: 5 %
Neutro Abs: 4.1 10*3/uL (ref 1.7–7.7)
Neutrophils Relative %: 90 %
Platelets: 91 10*3/uL — ABNORMAL LOW (ref 150–400)
RBC: 3.94 MIL/uL — ABNORMAL LOW (ref 4.22–5.81)
RDW: 12.7 % (ref 11.5–15.5)
WBC: 4.5 10*3/uL (ref 4.0–10.5)
nRBC: 0 % (ref 0.0–0.2)

## 2019-01-10 LAB — COMPREHENSIVE METABOLIC PANEL
ALT: 67 U/L — ABNORMAL HIGH (ref 0–44)
AST: 93 U/L — ABNORMAL HIGH (ref 15–41)
Albumin: 2.9 g/dL — ABNORMAL LOW (ref 3.5–5.0)
Alkaline Phosphatase: 46 U/L (ref 38–126)
Anion gap: 11 (ref 5–15)
BUN: 34 mg/dL — ABNORMAL HIGH (ref 8–23)
CO2: 26 mmol/L (ref 22–32)
Calcium: 8.7 mg/dL — ABNORMAL LOW (ref 8.9–10.3)
Chloride: 101 mmol/L (ref 98–111)
Creatinine, Ser: 0.8 mg/dL (ref 0.61–1.24)
GFR calc Af Amer: >60 mL/min (ref >60)
GFR calc non Af Amer: >60 mL/min (ref >60)
Glucose, Bld: 140 mg/dL — ABNORMAL HIGH (ref 70–99)
Potassium: 4.5 mmol/L (ref 3.5–5.1)
Sodium: 138 mmol/L (ref 135–145)
Total Bilirubin: 0.9 mg/dL (ref 0.3–1.2)
Total Protein: 6.4 g/dL — ABNORMAL LOW (ref 6.5–8.1)

## 2019-01-10 LAB — D-DIMER, QUANTITATIVE: D-Dimer, Quant: 2.34 ug/mL-FEU — ABNORMAL HIGH (ref 0.00–0.50)

## 2019-01-10 LAB — ABO/RH: ABO/RH(D): O POS

## 2019-01-10 LAB — C-REACTIVE PROTEIN: CRP: 19.6 mg/dL — ABNORMAL HIGH (ref <1.0)

## 2019-01-10 NOTE — Progress Notes (Signed)
PROGRESS NOTE    Anthony Carey  IFO:[id number] DOB: 12-Jan-1931 DOA: 01/10/2019 PCP: No primary care provider on file.   Brief Narrative:  Anthony Carey is a 85 y.o. male with medical history significant of hld, hypothryoid comes in with several days of worsening fatigue, sob and cough.  80s on RA on arrival, better now on 4 liters [place].  cxr with covid pna, covid pos.  Vitals stable.  Denies cp, abd pain n/v/d.  Referred for admission for covid pna causing hypoxia.  Assessment & Plan:   Principal Problem:   Pneumonia due to COVID-19 virus Active Problems:   Acute respiratory failure due to COVID-19 (HCC)   HLD (hyperlipidemia)   Hypothyroidism  Acute hypoxic respiratory failure in the setting of COVID-19 pneumonia -Patient continues on 6 L nasal cannula, continue to titrate as needed -Continue dexamethasone, remdesivir -Currently patient feels moderately improved since admission, hold off on plasma or Actemra -Continue supportive care -inhaler, incentive spirometry, flutter -Prone as tolerated -Continue to avoid avoid NSAIDs -Follow inflammatory markers:  Recent Labs    01/10/19 0200  DDIMER 2.34*  CRP 19.6*   Hyperlipidemia -Resume home medications  Hypothyroidism -Resume home medications  DVT prophylaxis: Lovenox Code Status: Full Family Communication: Over the phone Disposition Plan: Pending clinical course, likely disposition home pending symptomatic hypoxia and respiratory infection.   Subjective: No acute issues or events overnight, ongoing fatigue and shortness of breath, moderately improved since admission.  Otherwise declines chest pain, nausea, vomiting, headache, fevers, chills.  Objective: Vitals:   01/22/2019 2002 01/10/19 0319 01/10/19 0320 01/10/19 0800  BP: 121/64 (!) 106/54  (!) 106/57  Pulse: 80   68  Resp: 20   20  Temp: 98.2 F (36.8 C) 98.1 F (36.7 C)  98.1 F (36.7 C)  TempSrc: Oral Oral  Oral  SpO2: 93%   92%  Weight: 81.6 kg  81.6  kg   Height:   5\' 7"  (1.702 m)     Intake/Output Summary (Last 24 hours) at 01/10/2019 1444 Last data filed at 01/14/2019 2300 Gross per 24 hour  Intake 123 ml  Output 400 ml  Net -277 ml   Filed Weights   01/10/2019 2002 01/10/19 0320  Weight: 81.6 kg 81.6 kg    Examination:  General:  Pleasantly resting in bed, No acute distress. HEENT:  Normocephalic atraumatic.  Sclerae nonicteric, noninjected.  Extraocular movements intact bilaterally. Neck:  Without mass or deformity.  Trachea is midline. Lungs:  Clear to auscultate bilaterally without rhonchi, wheeze, or rales. Heart:  Regular rate and rhythm.  Without murmurs, rubs, or gallops. Abdomen:  Soft, nontender, nondistended.  Without guarding or rebound. Extremities: Without cyanosis, clubbing, edema, or obvious deformity. Vascular:  Dorsalis pedis and posterior tibial pulses palpable bilaterally. Skin:  Warm and dry, no erythema, no ulcerations.  Data Reviewed: I have personally reviewed following labs and imaging studies  CBC: Recent Labs  Lab 01/10/19 0200  WBC 4.5  NEUTROABS 4.1  HGB 12.8*  HCT 38.6*  MCV 98.0  PLT 91*   Basic Metabolic Panel: Recent Labs  Lab 01/10/19 0200  NA 138  K 4.5  CL 101  CO2 26  GLUCOSE 140*  BUN 34*  CREATININE 0.80  CALCIUM 8.7*   GFR: Estimated Creatinine Clearance: 67.8 mL/min (by C-G formula based on SCr of 0.8 mg/dL). Liver Function Tests: Recent Labs  Lab 01/10/19 0200  AST 93*  ALT 67*  ALKPHOS 46  BILITOT 0.9  PROT 6.4*  ALBUMIN 2.9*  No results found for this or any previous visit (from the past 240 hour(s)).   Radiology Studies: No results found.   Scheduled Meds: . dexamethasone (DECADRON) injection  6 mg Intravenous Q24H  . enoxaparin (LOVENOX) injection  40 mg Subcutaneous Q24H  . sodium chloride flush  3 mL Intravenous Q12H  . vitamin C  500 mg Oral Daily  . zinc sulfate  220 mg Oral Daily   Continuous Infusions: . sodium chloride    .  remdesivir 100 mg in NS 250 mL 100 mg (01/10/19 0950)     LOS: 1 day    Time spent: 54min   C , DO [facility]  If 7PM-7AM, please contact night-coverage [email or web address] Password [facility] 01/10/2019, 2:44 PM

## 2019-01-11 LAB — COMPREHENSIVE METABOLIC PANEL
ALT: 104 U/L — ABNORMAL HIGH (ref 0–44)
AST: 95 U/L — ABNORMAL HIGH (ref 15–41)
Albumin: 2.7 g/dL — ABNORMAL LOW (ref 3.5–5.0)
Alkaline Phosphatase: 52 U/L (ref 38–126)
Anion gap: 8 (ref 5–15)
BUN: 39 mg/dL — ABNORMAL HIGH (ref 8–23)
CO2: 25 mmol/L (ref 22–32)
Calcium: 8.2 mg/dL — ABNORMAL LOW (ref 8.9–10.3)
Chloride: 96 mmol/L — ABNORMAL LOW (ref 98–111)
Creatinine, Ser: 0.79 mg/dL (ref 0.61–1.24)
GFR calc Af Amer: >60 mL/min (ref >60)
GFR calc non Af Amer: >60 mL/min (ref >60)
Glucose, Bld: 169 mg/dL — ABNORMAL HIGH (ref 70–99)
Potassium: 4 mmol/L (ref 3.5–5.1)
Sodium: 129 mmol/L — ABNORMAL LOW (ref 135–145)
Total Bilirubin: 0.9 mg/dL (ref 0.3–1.2)
Total Protein: 5.9 g/dL — ABNORMAL LOW (ref 6.5–8.1)

## 2019-01-11 LAB — CBC WITH DIFFERENTIAL/PLATELET
Abs Immature Granulocytes: 0.09 10*3/uL — ABNORMAL HIGH (ref 0.00–0.07)
Basophils Absolute: 0 10*3/uL (ref 0.0–0.1)
Basophils Relative: 0 %
Eosinophils Absolute: 0 10*3/uL (ref 0.0–0.5)
Eosinophils Relative: 0 %
HCT: 38.5 % — ABNORMAL LOW (ref 39.0–52.0)
Hemoglobin: 12.6 g/dL — ABNORMAL LOW (ref 13.0–17.0)
Immature Granulocytes: 1 %
Lymphocytes Relative: 2 %
Lymphs Abs: 0.2 10*3/uL — ABNORMAL LOW (ref 0.7–4.0)
MCH: 32.4 pg (ref 26.0–34.0)
MCHC: 32.7 g/dL (ref 30.0–36.0)
MCV: 99 fL (ref 80.0–100.0)
Monocytes Absolute: 0.2 10*3/uL (ref 0.1–1.0)
Monocytes Relative: 3 %
Neutro Abs: 7.8 10*3/uL — ABNORMAL HIGH (ref 1.7–7.7)
Neutrophils Relative %: 94 %
Platelets: 101 10*3/uL — ABNORMAL LOW (ref 150–400)
RBC: 3.89 MIL/uL — ABNORMAL LOW (ref 4.22–5.81)
RDW: 12.9 % (ref 11.5–15.5)
WBC: 8.3 10*3/uL (ref 4.0–10.5)
nRBC: 0 % (ref 0.0–0.2)

## 2019-01-11 LAB — C-REACTIVE PROTEIN: CRP: 11.1 mg/dL — ABNORMAL HIGH (ref <1.0)

## 2019-01-11 LAB — D-DIMER, QUANTITATIVE: D-Dimer, Quant: 3.15 ug/mL-FEU — ABNORMAL HIGH (ref 0.00–0.50)

## 2019-01-11 MED ORDER — SALINE SPRAY 0.65 % NA SOLN
1.0000 | NASAL | Status: DC | PRN
  Administered 2019-01-11: 19:00:00 1 via NASAL
  Filled 2019-01-11: qty 44

## 2019-01-11 MED ORDER — LEVOTHYROXINE SODIUM 88 MCG PO TABS
88.0000 ug | ORAL_TABLET | Freq: Every day | ORAL | Status: DC
  Administered 2019-01-12 – 2019-01-16 (×5): 88 ug via ORAL
  Filled 2019-01-11 (×7): qty 1

## 2019-01-11 MED ORDER — FINASTERIDE 5 MG PO TABS
5.0000 mg | ORAL_TABLET | Freq: Every day | ORAL | Status: DC
  Administered 2019-01-11 – 2019-01-16 (×6): 5 mg via ORAL
  Filled 2019-01-11 (×6): qty 1

## 2019-01-11 MED ORDER — BETHANECHOL CHLORIDE 25 MG PO TABS
50.0000 mg | ORAL_TABLET | Freq: Two times a day (BID) | ORAL | Status: DC
  Administered 2019-01-11 – 2019-01-15 (×10): 50 mg via ORAL
  Filled 2019-01-11 (×12): qty 2

## 2019-01-11 MED ORDER — TERAZOSIN HCL 1 MG PO CAPS
1.0000 mg | ORAL_CAPSULE | Freq: Every day | ORAL | Status: DC
  Administered 2019-01-11 – 2019-01-15 (×5): 1 mg via ORAL
  Filled 2019-01-11 (×6): qty 1

## 2019-01-11 NOTE — Progress Notes (Signed)
PROGRESS NOTE    Anthony Carey  GMW:[id number] DOB: 1931-02-20 DOA: 02/02/19 PCP: No primary care provider on file.   Brief Narrative:  Anthony Carey is a 85 y.o. male with medical history significant of hld, hypothryoid comes in with several days of worsening fatigue, sob and cough.  80s on RA on arrival, better now on 4 liters [place].  cxr with covid pna, covid pos.  Vitals stable.  Denies cp, abd pain n/v/d.  Referred for admission for covid pna causing hypoxia.  Assessment & Plan:   Principal Problem:   Pneumonia due to COVID-19 virus Active Problems:   Acute respiratory failure due to COVID-19 ([place])   HLD (hyperlipidemia)   Hypothyroidism   Acute hypoxic respiratory failure in the setting of COVID-19 pneumonia -Continue to require oxygen, titrate as tolerated SpO2: (!) 85 % O2 Flow Rate (L/min): 6 L/min -Continue dexamethasone, remdesivir -Continues to improve clinically since admission, hold off on plasma or Actemra -Continue supportive care -inhaler, incentive spirometry, flutter -Prone as tolerated Recent Labs    01/10/19 0200 01/11/19 0303  DDIMER 2.34* 3.15*  CRP 19.6* 11.1*   Hyperlipidemia -Continue home medications  Hypothyroidism -Continue home medications  DVT prophylaxis: Lovenox Code Status: Full Family Communication: Over the phone previously Disposition Plan: Pending clinical course, likely disposition home pending symptomatic hypoxia and respiratory infection.  Subjective: No acute issues or events overnight, ongoing fatigue and shortness of breath improved since admission.  Otherwise declines chest pain, nausea, vomiting, headache, fevers, chills.  Objective: Vitals:   01/10/19 1542 01/10/19 1955 01/11/19 0356 01/11/19 0818  BP: 115/63 120/65 125/70 135/71  Pulse: 60 68 67 61  Resp: 18     Temp: 97.8 F (36.6 C) 98 F (36.7 C) 97.9 F (36.6 C) (!) 97.1 F (36.2 C)  TempSrc: Oral Oral Oral Axillary  SpO2: 92% 90% 90% (!) 85%  Weight:       Height:        Intake/Output Summary (Last 24 hours) at 01/11/2019 0828 Last data filed at 01/11/2019 0418 Gross per 24 hour  Intake 250 ml  Output 525 ml  Net -275 ml   Filed Weights   2019-02-02 2002 01/10/19 0320  Weight: 81.6 kg 81.6 kg    Examination:  General:  Pleasantly resting in bed, No acute distress.  Already nasal cannula quite well HEENT:  Normocephalic atraumatic.  Sclerae nonicteric, noninjected.  Extraocular movements intact bilaterally. Neck:  Without mass or deformity.  Trachea is midline. Lungs: Diminished breath sounds bilaterally without overt rhonchi wheeze or rales Heart:  Regular rate and rhythm.  Without murmurs, rubs, or gallops. Abdomen:  Soft, nontender, nondistended.  Without guarding or rebound. Extremities: Without cyanosis, clubbing, edema, or obvious deformity. Vascular:  Dorsalis pedis and posterior tibial pulses palpable bilaterally. Skin:  Warm and dry, no erythema, no ulcerations.  Data Reviewed: I have personally reviewed following labs and imaging studies  CBC: Recent Labs  Lab 01/10/19 0200 01/11/19 0303  WBC 4.5 8.3  NEUTROABS 4.1 7.8*  HGB 12.8* 12.6*  HCT 38.6* 38.5*  MCV 98.0 99.0  PLT 91* 440*   Basic Metabolic Panel: Recent Labs  Lab 01/10/19 0200 01/11/19 0303  NA 138 129*  K 4.5 4.0  CL 101 96*  CO2 26 25  GLUCOSE 140* 169*  BUN 34* 39*  CREATININE 0.80 0.79  CALCIUM 8.7* 8.2*   GFR: Estimated Creatinine Clearance: 67.8 mL/min (by C-G formula based on SCr of 0.79 mg/dL). Liver Function Tests: Recent Labs  Lab 01/10/19  0200 01/11/19 0303  AST 93* 95*  ALT 67* 104*  ALKPHOS 46 52  BILITOT 0.9 0.9  PROT 6.4* 5.9*  ALBUMIN 2.9* 2.7*   No results found for this or any previous visit (from the past 240 hour(s)).   Radiology Studies: No results found.   Scheduled Meds: . dexamethasone (DECADRON) injection  6 mg Intravenous Q24H  . enoxaparin (LOVENOX) injection  40 mg Subcutaneous Q24H  . sodium  chloride flush  3 mL Intravenous Q12H  . vitamin C  500 mg Oral Daily  . zinc sulfate  220 mg Oral Daily   Continuous Infusions: . sodium chloride    . remdesivir 100 mg in NS 250 mL 100 mg (01/11/19 0816)     LOS: 2 days    Time spent:  Azucena Fallen, DO [facility]  If 7PM-7AM, please contact night-coverage [email or web address] Password [facility] 01/11/2019, 8:28 AM

## 2019-01-11 NOTE — Progress Notes (Signed)
Spoke with wife and gave her an update. She is appreciative of it

## 2019-01-11 NOTE — Evaluation (Addendum)
Physical Therapy Evaluation Patient Details Name: Anthony Carey MRN: [id number] DOB: 1930/06/29 Today's Date: 01/11/2019   History of Present Illness  Anthony Carey is a 85 y.o. male with medical history significant of hld, hypothryoid , to ED 11/17 with several days of worsening fatigue, sob and cough.  80s on RA on arrival, CXray noted  with covid pna, tested positive for Covid.  Clinical Impression  The patient received on 6 L HFNC,finger probe- SPO2 92%. Placed probe on ear with 99%. Decreased  O2 to 4 L for ambulation with SPO2 dropping to 85%, increased to 6 L again. After ambulating x 25', SPO2 82%. Patient with 3/4 dyspnea. Encouraged flutter valve use and IS. Pt admitted with above diagnosis.  Pt currently with functional limitations due to the deficits listed below (see PT Problem List). Pt will benefit from skilled PT to increase their independence and safety with mobility to allow discharge to the venue listed below.   Patient independent and lived alone PTA. Will most likely need some assistance from family at DC.  If does not have assistance, may benefit from SNF, hopefully will progress and not need SNF.   (Adedendum: Patient lives with spouse)  Follow Up Recommendations Home health PT;Supervision/Assistance - 24 hour    Equipment Recommendations  Rolling walker with 5" wheels    Recommendations for Other Services   OT    Precautions / Restrictions Precautions Precautions: Fall Precaution Comments: monitor sats and HR, wean O2- use ear      Mobility  Bed Mobility Overal bed mobility: Needs Assistance Bed Mobility: Supine to Sit     Supine to sit: Supervision     General bed mobility comments: no assistnace required  Transfers Overall transfer level: Needs assistance Equipment used: Rolling walker (2 wheeled) Transfers: Sit to/from Stand Sit to Stand: Min guard         General transfer comment: steady assist to rise from bed, cues for hand  placement  Ambulation/Gait Ambulation/Gait assistance: Min assist Gait Distance (Feet): 25 Feet Assistive device: Rolling walker (2 wheeled) Gait Pattern/deviations: Step-to pattern;Step-through pattern;Wide base of support;Trunk flexed Gait velocity: decr   General Gait Details: steady assist for turning at the door. patient on 4 L then 6 L too maintain SPO2 at 85%.  Stairs            Wheelchair Mobility    Modified Rankin (Stroke Patients Only)       Balance Overall balance assessment: Needs assistance Sitting-balance support: No upper extremity supported;Feet supported Sitting balance-Leahy Scale: Fair Sitting balance - Comments: some difficulty leaning forward to don socks on bed edge, min guard   Standing balance support: Bilateral upper extremity supported;During functional activity Standing balance-Leahy Scale: Fair                               Pertinent Vitals/Pain Pain Assessment: Faces Faces Pain Scale: Hurts a little bit Pain Location: abdomen, from coughing Pain Descriptors / Indicators: Discomfort Pain Intervention(s): Monitored during session    Home Living Family/patient expects to be discharged to:: Private residence Living Arrangements: Alone- this is addendum: Patient lives with wife not alone as pt. Indicated. Available Help at Discharge: Family;Available PRN/intermittently Type of Home: House Home Access: Stairs to enter   [employer] of Steps: 2 Home Layout: One level Home Equipment: None      Prior Function Level of Independence: Independent         Comments:  drives , mowes     Hand Dominance        Extremity/Trunk Assessment   Upper Extremity Assessment Upper Extremity Assessment: Defer to OT evaluation    Lower Extremity Assessment Lower Extremity Assessment: Generalized weakness(noted genu varus and flexed knees when walking)    Cervical / Trunk Assessment Cervical / Trunk Assessment:  Normal  Communication   Communication: No difficulties  Cognition Arousal/Alertness: Awake/alert Behavior During Therapy: WFL for tasks assessed/performed Overall Cognitive Status: No family/caregiver present to determine baseline cognitive functioning Area of Impairment: Following commands;Problem solving                         Safety/Judgement: Decreased awareness of deficits   Problem Solving: Slow processing;Difficulty sequencing;Requires verbal cues General Comments: patient wanted to be able to get up ad lib, reiterated safety, repeated several times, patient  wanted to walk the halls but was very SOB with room distance      General Comments      Exercises     Assessment/Plan    PT Assessment Patient needs continued PT services  PT Problem List Decreased strength;Decreased activity tolerance;Decreased mobility;Decreased cognition;Decreased safety awareness;Cardiopulmonary status limiting activity;Decreased knowledge of use of DME;Decreased knowledge of precautions       PT Treatment Interventions DME instruction;Therapeutic activities;Balance training;Cognitive remediation;Gait training;Functional mobility training;Therapeutic exercise;Patient/family education    PT Goals (Current goals can be found in the Care Plan section)  Acute Rehab PT Goals Patient Stated Goal: to walk, go home and beable to mow PT Goal Formulation: With patient Time For Goal Achievement: 01/25/19 Potential to Achieve Goals: Good    Frequency Min 3X/week   Barriers to discharge Decreased caregiver support      Co-evaluation               AM-PAC PT "6 Clicks" Mobility  Outcome Measure Help needed turning from your back to your side while in a flat bed without using bedrails?: None Help needed moving from lying on your back to sitting on the side of a flat bed without using bedrails?: None Help needed moving to and from a bed to a chair (including a wheelchair)?: A  Little Help needed standing up from a chair using your arms (e.g., wheelchair or bedside chair)?: A Little Help needed to walk in hospital room?: A Little Help needed climbing 3-5 steps with a railing? : A Lot 6 Click Score: 19    End of Session Equipment Utilized During Treatment: Gait belt Activity Tolerance: Treatment limited secondary to medical complications (Comment) Patient left: in chair;with call bell/phone within reach;with chair alarm set Nurse Communication: Mobility status PT Visit Diagnosis: Muscle weakness (generalized) (M62.81)    Time: 4098-1191 PT Time Calculation (min) (ACUTE ONLY): 40 min   Charges:   PT Evaluation $PT Eval Moderate Complexity: 1 Mod PT Treatments $Gait Training: 23-37 mins        [place]  Office [phone]   Claretha Cooper 01/11/2019, 2:36 PM

## 2019-01-12 ENCOUNTER — Inpatient Hospital Stay (HOSPITAL_COMMUNITY): Payer: Medicare Other

## 2019-01-12 DIAGNOSIS — J1289 Other viral pneumonia: Secondary | ICD-10-CM

## 2019-01-12 DIAGNOSIS — U071 COVID-19: Principal | ICD-10-CM

## 2019-01-12 LAB — CBC WITH DIFFERENTIAL/PLATELET
Abs Immature Granulocytes: 0.13 10*3/uL — ABNORMAL HIGH (ref 0.00–0.07)
Basophils Absolute: 0 10*3/uL (ref 0.0–0.1)
Basophils Relative: 0 %
Eosinophils Absolute: 0 10*3/uL (ref 0.0–0.5)
Eosinophils Relative: 0 %
HCT: 39.5 % (ref 39.0–52.0)
Hemoglobin: 13.1 g/dL (ref 13.0–17.0)
Immature Granulocytes: 1 %
Lymphocytes Relative: 2 %
Lymphs Abs: 0.2 10*3/uL — ABNORMAL LOW (ref 0.7–4.0)
MCH: 32 pg (ref 26.0–34.0)
MCHC: 33.2 g/dL (ref 30.0–36.0)
MCV: 96.6 fL (ref 80.0–100.0)
Monocytes Absolute: 0.2 10*3/uL (ref 0.1–1.0)
Monocytes Relative: 3 %
Neutro Abs: 8.6 10*3/uL — ABNORMAL HIGH (ref 1.7–7.7)
Neutrophils Relative %: 94 %
Platelets: 107 10*3/uL — ABNORMAL LOW (ref 150–400)
RBC: 4.09 MIL/uL — ABNORMAL LOW (ref 4.22–5.81)
RDW: 12.8 % (ref 11.5–15.5)
WBC: 9.1 10*3/uL (ref 4.0–10.5)
nRBC: 0 % (ref 0.0–0.2)

## 2019-01-12 LAB — COMPREHENSIVE METABOLIC PANEL
ALT: 100 U/L — ABNORMAL HIGH (ref 0–44)
AST: 67 U/L — ABNORMAL HIGH (ref 15–41)
Albumin: 2.5 g/dL — ABNORMAL LOW (ref 3.5–5.0)
Alkaline Phosphatase: 74 U/L (ref 38–126)
Anion gap: 8 (ref 5–15)
BUN: 31 mg/dL — ABNORMAL HIGH (ref 8–23)
CO2: 26 mmol/L (ref 22–32)
Calcium: 8.4 mg/dL — ABNORMAL LOW (ref 8.9–10.3)
Chloride: 100 mmol/L (ref 98–111)
Creatinine, Ser: 0.64 mg/dL (ref 0.61–1.24)
GFR calc Af Amer: >60 mL/min (ref >60)
GFR calc non Af Amer: >60 mL/min (ref >60)
Glucose, Bld: 145 mg/dL — ABNORMAL HIGH (ref 70–99)
Potassium: 4.1 mmol/L (ref 3.5–5.1)
Sodium: 134 mmol/L — ABNORMAL LOW (ref 135–145)
Total Bilirubin: 0.9 mg/dL (ref 0.3–1.2)
Total Protein: 5.9 g/dL — ABNORMAL LOW (ref 6.5–8.1)

## 2019-01-12 LAB — POCT I-STAT 7, (LYTES, BLD GAS, ICA,H+H)
Acid-Base Excess: 2 mmol/L (ref 0.0–2.0)
Bicarbonate: 24.5 mmol/L (ref 20.0–28.0)
Calcium, Ion: 1.18 mmol/L (ref 1.15–1.40)
HCT: 37 % — ABNORMAL LOW (ref 39.0–52.0)
Hemoglobin: 12.6 g/dL — ABNORMAL LOW (ref 13.0–17.0)
O2 Saturation: 80 %
Patient temperature: 98.4
Potassium: 4 mmol/L (ref 3.5–5.1)
Sodium: 133 mmol/L — ABNORMAL LOW (ref 135–145)
TCO2: 25 mmol/L (ref 22–32)
pCO2 arterial: 31.5 mmHg — ABNORMAL LOW (ref 32.0–48.0)
pH, Arterial: 7.498 — ABNORMAL HIGH (ref 7.350–7.450)
pO2, Arterial: 39 mmHg — CL (ref 83.0–108.0)

## 2019-01-12 LAB — TYPE AND SCREEN
ABO/RH(D): O POS
Antibody Screen: NEGATIVE

## 2019-01-12 LAB — MRSA PCR SCREENING: MRSA by PCR: NEGATIVE

## 2019-01-12 LAB — D-DIMER, QUANTITATIVE: D-Dimer, Quant: >20 ug/mL-FEU — ABNORMAL HIGH (ref 0.00–0.50)

## 2019-01-12 LAB — C-REACTIVE PROTEIN: CRP: 7.1 mg/dL — ABNORMAL HIGH (ref <1.0)

## 2019-01-12 MED ORDER — DIPHENHYDRAMINE HCL 50 MG/ML IJ SOLN
50.0000 mg | Freq: Once | INTRAMUSCULAR | Status: AC
  Administered 2019-01-12: 50 mg via INTRAVENOUS
  Filled 2019-01-12: qty 1

## 2019-01-12 MED ORDER — IOHEXOL 350 MG/ML SOLN
75.0000 mL | Freq: Once | INTRAVENOUS | Status: AC | PRN
  Administered 2019-01-12: 75 mL via INTRAVENOUS

## 2019-01-12 MED ORDER — ENOXAPARIN SODIUM 40 MG/0.4ML ~~LOC~~ SOLN
40.0000 mg | Freq: Two times a day (BID) | SUBCUTANEOUS | Status: DC
  Administered 2019-01-12 – 2019-01-14 (×5): 40 mg via SUBCUTANEOUS
  Filled 2019-01-12 (×5): qty 0.4

## 2019-01-12 MED ORDER — CHLORHEXIDINE GLUCONATE CLOTH 2 % EX PADS
6.0000 | MEDICATED_PAD | Freq: Every day | CUTANEOUS | Status: DC
  Administered 2019-01-12 – 2019-01-16 (×6): 6 via TOPICAL

## 2019-01-12 MED ORDER — SODIUM CHLORIDE 0.9% IV SOLUTION
Freq: Once | INTRAVENOUS | Status: AC
  Administered 2019-01-12: 15:00:00 via INTRAVENOUS

## 2019-01-12 MED ORDER — SODIUM CHLORIDE 0.9 % IV BOLUS
500.0000 mL | Freq: Once | INTRAVENOUS | Status: AC
  Administered 2019-01-12: 500 mL via INTRAVENOUS

## 2019-01-12 NOTE — Progress Notes (Signed)
RT and ICU Charge RN called to patient's room post patient fall.  Patient transferred to ICU and placed on Heated High Flow [place] 30L, 100%.  Patient able to answer questions from the bedside RN.  RT will continue to monitor.

## 2019-01-12 NOTE — Progress Notes (Signed)
Called wife gave her an update regarding how the pt is doing. Will continue to monitor. She is aware that we will go to CT at 1145.

## 2019-01-12 NOTE — Consult Note (Signed)
NAME:  Anthony Carey, MRN:  [id number], DOB:  09/09/1930, LOS: 3 ADMISSION DATE:  January 22, 2019, CONSULTATION DATE: 01/12/2019 REFERRING MD: Carma Leaven MD, CHIEF COMPLAINT: COVID-19 pneumonia  Brief History   85 year old admitted with COVID-19 pneumonia Transfer to ICU on 11/24 was made hypoxia and need for high flow nasal   Past Medical History  Hyperlipidemia, hypothyroidism  Significant Hospital Events   10/17-admit 1/20-transfer to ICU  Consults:  PCCM  Procedures:    Significant Diagnostic Tests:  CTA chest 01/12/2019-no pulmonary embolism, bilateral dense consolidative groundglass opacities.  Mild bilateral pleural effusion. I reviewed images personally.  Micro Data:    Antimicrobials:  Remdesivir 11/17 >  Decadron 11/17 >  Interim history/subjective:    Objective   Blood pressure 126/70, pulse 85, temperature 98.3 F (36.8 C), temperature source Oral, resp. rate (!) 30, height 5\' 7"  (1.702 m), weight 81.6 kg, SpO2 92 %.    FiO2 (%):  [100 %] 100 %   Intake/Output Summary (Last 24 hours) at 01/12/2019 1620 Last data filed at 01/12/2019 1500 Gross per 24 hour  Intake 694.36 ml  Output 625 ml  Net 69.36 ml   Filed Weights   01/22/2019 2002 01/10/19 0320  Weight: 81.6 kg 81.6 kg    Examination: Blood pressure 126/70, pulse 85, temperature 98.3 F (36.8 C), temperature source Oral, resp. rate (!) 30, height 5\' 7"  (1.702 m), weight 81.6 kg, SpO2 92 %. Gen:      No acute distress HEENT:  EOMI, sclera anicteric Neck:     No masses; no thyromegaly Lungs:    Clear to auscultation bilaterally; normal respiratory effort CV:         Regular rate and rhythm; no murmurs Abd:      + bowel sounds; soft, non-tender; no palpable masses, no distension Ext:    No edema; adequate peripheral perfusion Skin:      Warm and dry; no rash Neuro: Somnolent  Resolved Hospital Problem list     Assessment & Plan:  COVID-19 pneumonia, acute hypoxic respiratory  failure Continue high flow nasal cannula Awake proning Continue Decadron, remdesivir  No need for intubation at present.  We will continue to monitor closely.  Elevated D-dimer No PE on CT scan.  Continue Lovenox for prophylaxis   Best practice:  Diet: P.o. diet Pain/Anxiety/Delirium protocol (if indicated): NA VAP protocol (if indicated): NA DVT prophylaxis: Lovenox GI prophylaxis: NA Glucose control: Monitor sugars Mobility: Bed Code Status: Full Family Communication: Per primary team Disposition: ICU  Labs   CBC: Recent Labs  Lab 01/10/19 0200 01/11/19 0303 01/12/19 0021 01/12/19 1238  WBC 4.5 8.3 9.1  --   NEUTROABS 4.1 7.8* 8.6*  --   HGB 12.8* 12.6* 13.1 12.6*  HCT 38.6* 38.5* 39.5 37.0*  MCV 98.0 99.0 96.6  --   PLT 91* 101* 107*  --     Basic Metabolic Panel: Recent Labs  Lab 01/10/19 0200 01/11/19 0303 01/12/19 0021 01/12/19 1238  NA 138 129* 134* 133*  K 4.5 4.0 4.1 4.0  CL 101 96* 100  --   CO2 26 25 26   --   GLUCOSE 140* 169* 145*  --   BUN 34* 39* 31*  --   CREATININE 0.80 0.79 0.64  --   CALCIUM 8.7* 8.2* 8.4*  --    GFR: Estimated Creatinine Clearance: 67.8 mL/min (by C-G formula based on SCr of 0.64 mg/dL). Recent Labs  Lab 01/10/19 0200 01/11/19 0303 01/12/19 0021  WBC 4.5 8.3 9.1  Liver Function Tests: Recent Labs  Lab 01/10/19 0200 01/11/19 0303 01/12/19 0021  AST 93* 95* 67*  ALT 67* 104* 100*  ALKPHOS 46 52 74  BILITOT 0.9 0.9 0.9  PROT 6.4* 5.9* 5.9*  ALBUMIN 2.9* 2.7* 2.5*   No results for input(s): LIPASE, AMYLASE in the last 168 hours. No results for input(s): AMMONIA in the last 168 hours.  ABG    Component Value Date/Time   PHART 7.498 (H) 01/12/2019 1238   PCO2ART 31.5 (L) 01/12/2019 1238   PO2ART 39.0 (LL) 01/12/2019 1238   HCO3 24.5 01/12/2019 1238   TCO2 25 01/12/2019 1238   O2SAT 80.0 01/12/2019 1238     Coagulation Profile: No results for input(s): INR, PROTIME in the last 168 hours.   Cardiac Enzymes: No results for input(s): CKTOTAL, CKMB, CKMBINDEX, TROPONINI in the last 168 hours.  HbA1C: No results found for: HGBA1C  CBG: No results for input(s): GLUCAP in the last 168 hours.  Review of Systems:   Unable to obtain review of symptoms due to altered mental status  Past Medical History  He,  has no past medical history on file.   Surgical History   History reviewed. No pertinent surgical history.   Social History   reports that he quit smoking about 35 years ago. His smoking use included cigarettes. He has never used smokeless tobacco. He reports that he does not drink alcohol or use drugs.   Family History   His family history is not on file.   Allergies Allergies  Allergen Reactions  . Avodart [Dutasteride] Other (See Comments)    Joint pain  . Contrast Media [Iodinated Diagnostic Agents] Other (See Comments)    Severe weakness and fatigue  . Lipitor [Atorvastatin] Other (See Comments)    Joint pain, weakness     Home Medications  Prior to Admission medications   Medication Sig Start Date End Date Taking? Authorizing Provider  azithromycin (ZITHROMAX) 250 MG tablet Take 250-500 mg by mouth as directed. Only took 1 dose 01/08/19  Yes [provider]  bethanechol (URECHOLINE) 50 MG tablet Take 50 mg by mouth 2 (two) times daily.   Yes [provider]  finasteride (PROSCAR) 5 MG tablet Take 5 mg by mouth daily.   Yes [provider]  levothyroxine (SYNTHROID) 88 MCG tablet Take 88 mcg by mouth daily before breakfast.   Yes [provider]  predniSONE (STERAPRED UNI-PAK 21 TAB) 10 MG (21) TBPK tablet Take 10-60 mg by mouth as directed. 01/08/19  Yes [provider]  terazosin (HYTRIN) 1 MG capsule Take 1 mg by mouth at bedtime.   Yes [provider]  albuterol (VENTOLIN HFA) 108 (90 Base) MCG/ACT inhaler Inhale 2 puffs into the lungs every 6 (six) hours as needed for wheezing or shortness of breath.  01/08/19   [provider]     Critical care time: 21    The patient is critically ill with multiple organ system failure and requires high complexity decision making for assessment and support, frequent evaluation and titration of therapies, advanced monitoring, review of radiographic studies and interpretation of complex data.   Critical Care Time devoted to patient care services, exclusive of separately billable procedures, described in this note is 35 minutes.   Marshell Garfinkel MD [facility] Pager [phone] If no answer call [phone] 01/12/2019, 4:23 PM

## 2019-01-12 NOTE — Progress Notes (Signed)
Pt's wife called and given update on pt's condition and transfer to ICU for increasing oxygen requirements. All her questions were answered, and she expressed her thanks for the care Anthony Carey is receiving.

## 2019-01-12 NOTE — Progress Notes (Signed)
PROGRESS NOTE    Anthony Carey  MRN:[id number] DOB: 07/12/1930 DOA: 01/04/2019 PCP: [facility]   Brief Narrative:  Anthony Carey is a 85 y.o. male with medical history significant of hld, hypothryoid comes in with several days of worsening fatigue, sob and cough.  80s on RA on arrival, better now on 4 liters [place].  cxr with covid pna, covid pos.  Vitals stable.  Denies cp, abd pain n/v/d.  Referred for admission for covid pna causing hypoxia.  Assessment & Plan:   Principal Problem:   Pneumonia due to COVID-19 virus Active Problems:   Acute respiratory failure due to COVID-19 (HCC)   HLD (hyperlipidemia)   Hypothyroidism   Acute hypoxic respiratory failure in the setting of COVID-19 pneumonia Severe ARDS criteria met -Continue to require oxygen, titrate as tolerated SpO2: 96 % O2 Flow Rate (L/min): 25 L/min FiO2 (%): 100 % -Continue dexamethasone, remdesivir -Given acute worsening administer plasma, Actemra on 01/12/2019 -Continue supportive care -inhaler, incentive spirometry, flutter -Acute worsening hypoxia overnight with elevated D-dimer, CTA performed and found to be negative for PE, remarkable bilateral airspace disease concerning for this infiltrate/edema -transition to heated high flow in the ICU, tolerating quite well -Prone as tolerated Recent Labs    01/10/19 0200 01/11/19 0303 01/12/19 0021  DDIMER 2.34* 3.15* >20.00*  CRP 19.6* 11.1* 7.1*   Hyperlipidemia -Continue home medications  Ambulatory dysfunction/fall -Patient had slip/fall out of bed on 01/12/19 - awake, alert oriented, denies any symptoms -No indication for imaging   Hypothyroidism -Continue home medications  DVT prophylaxis: Lovenox BID dosing Code Status: Full Family Communication: Over the phone previously Disposition Plan: Currently on heated high flow in the ICU, worsening respiratory status over the past 24 hours, patient continues to have high risk of morbidity  mortality given advanced age and comorbid conditions.  Will follow closely, disposition pending clinical course.  Subjective: Worsening respiratory status overnight, D-dimer markedly elevated, concerning for PE -CTA negative, placed in the ICU on heated high flow, improving markedly. Otherwise declines chest pain, nausea, vomiting, headache, fevers, chills.  Objective: Vitals:   01/12/19 1420 01/12/19 1500 01/12/19 1504 01/12/19 1600  BP:  126/70  131/82  Pulse: 95 85 85 74  Resp: 19 (!) 30 (!) 30 (!) 23  Temp:  98.3 F (36.8 C)  98.3 F (36.8 C)  TempSrc:  Oral  Oral  SpO2: 96% 92% 93% 96%  Weight:      Height:        Intake/Output Summary (Last 24 hours) at 01/12/2019 1652 Last data filed at 01/12/2019 1600 Gross per 24 hour  Intake 704.36 ml  Output 625 ml  Net 79.36 ml   Filed Weights   01/20/2019 2002 01/10/19 0320  Weight: 81.6 kg 81.6 kg    Examination:  General:  Pleasantly resting in bed, No acute distress.  Tolerating nasal cannula quite well HEENT:  Normocephalic atraumatic.  Sclerae nonicteric, noninjected.  Extraocular movements intact bilaterally. Neck:  Without mass or deformity.  Trachea is midline. Lungs: Diminished breath sounds bilaterally without overt rhonchi wheeze or rales Heart:  Regular rate and rhythm.  Without murmurs, rubs, or gallops. Abdomen:  Soft, nontender, nondistended.  Without guarding or rebound. Extremities: Without cyanosis, clubbing, edema, or obvious deformity. Vascular:  Dorsalis pedis and posterior tibial pulses palpable bilaterally. Skin:  Warm and dry, no erythema, no ulcerations.  Data Reviewed: I have personally reviewed following labs and imaging studies  CBC: Recent Labs  Lab 01/10/19 0200 01/11/19 0303 01/12/19 0021   01/12/19 1238  WBC 4.5 8.3 9.1  --   NEUTROABS 4.1 7.8* 8.6*  --   HGB 12.8* 12.6* 13.1 12.6*  HCT 38.6* 38.5* 39.5 37.0*  MCV 98.0 99.0 96.6  --   PLT 91* 101* 107*  --    Basic Metabolic Panel:  Recent Labs  Lab 01/10/19 0200 01/11/19 0303 01/12/19 0021 01/12/19 1238  NA 138 129* 134* 133*  K 4.5 4.0 4.1 4.0  CL 101 96* 100  --   CO2 26 25 26  --   GLUCOSE 140* 169* 145*  --   BUN 34* 39* 31*  --   CREATININE 0.80 0.79 0.64  --   CALCIUM 8.7* 8.2* 8.4*  --    GFR: Estimated Creatinine Clearance: 67.8 mL/min (by C-G formula based on SCr of 0.64 mg/dL). Liver Function Tests: Recent Labs  Lab 01/10/19 0200 01/11/19 0303 01/12/19 0021  AST 93* 95* 67*  ALT 67* 104* 100*  ALKPHOS 46 52 74  BILITOT 0.9 0.9 0.9  PROT 6.4* 5.9* 5.9*  ALBUMIN 2.9* 2.7* 2.5*   No results found for this or any previous visit (from the past 240 hour(s)).   Radiology Studies: Ct Angio Chest Pe W Or Wo Contrast  Result Date: 01/12/2019 CLINICAL DATA:  Chest pain, weakness and fatigue EXAM: CT ANGIOGRAPHY CHEST WITH CONTRAST TECHNIQUE: Multidetector CT imaging of the chest was performed using the standard protocol during bolus administration of intravenous contrast. Multiplanar CT image reconstructions and MIPs were obtained to evaluate the vascular anatomy. CONTRAST:  75mL OMNIPAQUE IOHEXOL 350 MG/ML SOLN COMPARISON:  Thyroid ultrasound, 12/28/2013 FINDINGS: Cardiovascular: Satisfactory opacification of the pulmonary arteries to the segmental level. No evidence of pulmonary embolism. Mild cardiomegaly. Three-vessel coronary artery calcifications. No pericardial effusion. Aortic valve calcifications. Aortic atherosclerosis. Mediastinum/Nodes: No enlarged mediastinal, hilar, or axillary lymph nodes. Status post right thyroidectomy. Large, hypodense nodules of the left lobe of the thyroid. Trachea, and esophagus demonstrate no significant findings. Lungs/Pleura: There is very dense, confluent and consolidative ground-glass opacity throughout the lungs bilaterally, with some sparing of the lung bases and subpleural lungs throughout. There may be a discrete nodule versus small consolidation of the medial  segment right middle lobe measuring 9 mm (series 5, image 108). Small bilateral pleural effusions. Upper Abdomen: No acute abnormality. Musculoskeletal: No chest wall abnormality. Disc degenerative disease and ankylosis of the thoracic spine. Review of the MIP images confirms the above findings. IMPRESSION: 1.  Negative examination for pulmonary embolism. 2. There is very dense, confluent and consolidative ground-glass opacity throughout the lungs bilaterally, with some sparing of the lung bases and subpleural lungs throughout. Findings are most consistent with multifocal infection, edema, and/or ARDS. 3. There may be a discrete nodule versus small consolidation of the medial segment right middle lobe measuring 9 mm (series 5, image 108). Recommend follow-up CT in 3-6 months if clinically appropriate. 4.  Small bilateral pleural effusions. 5.  Coronary artery disease. 6. Aortic valve calcifications. Correlate for echocardiographic evidence of aortic valve stenosis. 7.  Aortic Atherosclerosis (ICD10-I70.0). 8. Multiple left thyroid nodules status post right thyroidectomy, consistent with goiter better assessed by prior ultrasound. Electronically Signed   By: Alex  Bibbey M.D.   On: 01/12/2019 12:55     Scheduled Meds: . bethanechol  50 mg Oral BID  . Chlorhexidine Gluconate Cloth  6 each Topical Daily  . dexamethasone (DECADRON) injection  6 mg Intravenous Q24H  . enoxaparin (LOVENOX) injection  40 mg Subcutaneous Q12H  . finasteride  5   mg Oral Daily  . levothyroxine  88 mcg Oral QAC breakfast  . sodium chloride flush  3 mL Intravenous Q12H  . terazosin  1 mg Oral QHS  . vitamin C  500 mg Oral Daily  . zinc sulfate  220 mg Oral Daily   Continuous Infusions: . sodium chloride    . remdesivir 100 mg in NS 250 mL 100 mg (01/12/19 1057)     LOS: 3 days   Time spent: 46mn  Shaquelle Hernon C Tammatha Cobb, DO [facility]  If 7PM-7AM, please contact night-coverage [email or web address] Password [email or web address] 01/12/2019, 4:52 PM

## 2019-01-12 NOTE — Plan of Care (Signed)
Pt found on floor all side rails were up his left arm was caught in the rail sustained a small abrasion  Patient did not hit his head. He was recently taken to CT when the patient was brought back the bed was not plugged in so the bed alarm was not on. Dr Avon Gully paged awaiting call back to notify him.

## 2019-01-13 LAB — CBC WITH DIFFERENTIAL/PLATELET
Abs Immature Granulocytes: 0.12 10*3/uL — ABNORMAL HIGH (ref 0.00–0.07)
Basophils Absolute: 0 10*3/uL (ref 0.0–0.1)
Basophils Relative: 0 %
Eosinophils Absolute: 0 10*3/uL (ref 0.0–0.5)
Eosinophils Relative: 0 %
HCT: 36.9 % — ABNORMAL LOW (ref 39.0–52.0)
Hemoglobin: 12.7 g/dL — ABNORMAL LOW (ref 13.0–17.0)
Immature Granulocytes: 2 %
Lymphocytes Relative: 2 %
Lymphs Abs: 0.2 10*3/uL — ABNORMAL LOW (ref 0.7–4.0)
MCH: 33.5 pg (ref 26.0–34.0)
MCHC: 34.4 g/dL (ref 30.0–36.0)
MCV: 97.4 fL (ref 80.0–100.0)
Monocytes Absolute: 0.1 10*3/uL (ref 0.1–1.0)
Monocytes Relative: 2 %
Neutro Abs: 6.2 10*3/uL (ref 1.7–7.7)
Neutrophils Relative %: 94 %
Platelets: 84 10*3/uL — ABNORMAL LOW (ref 150–400)
RBC: 3.79 MIL/uL — ABNORMAL LOW (ref 4.22–5.81)
RDW: 12.9 % (ref 11.5–15.5)
WBC: 6.6 10*3/uL (ref 4.0–10.5)
nRBC: 0 % (ref 0.0–0.2)

## 2019-01-13 LAB — BPAM FFP
Blood Product Expiration Date: 202011211814
ISSUE DATE / TIME: 202011202239
Unit Type and Rh: 5100

## 2019-01-13 LAB — PREPARE FRESH FROZEN PLASMA: Unit division: 0

## 2019-01-13 LAB — COMPREHENSIVE METABOLIC PANEL
ALT: 69 U/L — ABNORMAL HIGH (ref 0–44)
AST: 46 U/L — ABNORMAL HIGH (ref 15–41)
Albumin: 2.6 g/dL — ABNORMAL LOW (ref 3.5–5.0)
Alkaline Phosphatase: 91 U/L (ref 38–126)
Anion gap: 9 (ref 5–15)
BUN: 25 mg/dL — ABNORMAL HIGH (ref 8–23)
CO2: 25 mmol/L (ref 22–32)
Calcium: 8.3 mg/dL — ABNORMAL LOW (ref 8.9–10.3)
Chloride: 100 mmol/L (ref 98–111)
Creatinine, Ser: 0.64 mg/dL (ref 0.61–1.24)
GFR calc Af Amer: >60 mL/min (ref >60)
GFR calc non Af Amer: >60 mL/min (ref >60)
Glucose, Bld: 181 mg/dL — ABNORMAL HIGH (ref 70–99)
Potassium: 4.5 mmol/L (ref 3.5–5.1)
Sodium: 134 mmol/L — ABNORMAL LOW (ref 135–145)
Total Bilirubin: 1.3 mg/dL — ABNORMAL HIGH (ref 0.3–1.2)
Total Protein: 5.6 g/dL — ABNORMAL LOW (ref 6.5–8.1)

## 2019-01-13 LAB — D-DIMER, QUANTITATIVE: D-Dimer, Quant: >20 ug/mL-FEU — ABNORMAL HIGH (ref 0.00–0.50)

## 2019-01-13 LAB — C-REACTIVE PROTEIN: CRP: 14.2 mg/dL — ABNORMAL HIGH (ref <1.0)

## 2019-01-13 MED ORDER — UNJURY CHICKEN SOUP POWDER
2.0000 [oz_av] | Freq: Four times a day (QID) | ORAL | Status: DC | PRN
  Administered 2019-01-13: 17:00:00 2 [oz_av] via ORAL
  Filled 2019-01-13 (×2): qty 27

## 2019-01-13 NOTE — Progress Notes (Signed)
PROGRESS NOTE    Anthony Carey  XOV:[id number] DOB: 07/27/1930 DOA: 01/08/2019 PCP: [facility]   Brief Narrative:  Anthony Carey is a 85 y.o. male with medical history significant of hld, hypothryoid comes in with several days of worsening fatigue, sob and cough.  80s on RA on arrival, better now on 4 liters [place].  cxr with covid pna, covid pos.  Vitals stable.  Denies cp, abd pain n/v/d.  Referred for admission for covid pna causing hypoxia.  Assessment & Plan:   Principal Problem:   Pneumonia due to COVID-19 virus Active Problems:   Acute respiratory failure due to COVID-19 ([place])   HLD (hyperlipidemia)   Hypothyroidism   Acute hypoxic respiratory failure in the setting of COVID-19 pneumonia Severe ARDS criteria met -Continue to require oxygen, titrate as tolerated SpO2: 92 % O2 Flow Rate (L/min): 25 L/min FiO2 (%): 90 % -Continue dexamethasone, remdesivir -Given acute worsening administer plasma, Actemra on 01/12/2019 -Continue supportive care -inhaler, incentive spirometry, flutter -Elevated D-dimer, CTA performed and found to be negative for PE, remarkable bilateral airspace disease concerning for this infiltrate/edema -DVT study pending -Transition to heated high flow in the ICU, tolerating quite well -Prone as tolerated Recent Labs    01/11/19 0303 01/12/19 0021 01/13/19 0345  DDIMER 3.15* >20.00*  --   CRP 11.1* 7.1* 14.2*   Hyperlipidemia -Continue home medications  Ambulatory dysfunction/fall -Patient had slip/fall out of bed on 01/12/19 - awake, alert oriented, denies any symptoms -No indication for imaging   Hypothyroidism -Continue home medications  DVT prophylaxis: Lovenox BID dosing Code Status: Full Family Communication: Over the phone previously Disposition Plan: Currently on heated high flow in the ICU, stabilizing respiratory status over the past 24 hours, patient continues to have high risk of morbidity mortality given  advanced age and comorbid conditions.  Will follow closely, disposition pending clinical course.  Subjective: Respiratory status overnight stablizing, D-dimer markedly elevated, concerning for PE -CTA negative, placed in the ICU on heated high flow, improving markedly; BLE [place] pending. Otherwise declines chest pain, nausea, vomiting, headache, fevers, chills.  Objective: Vitals:   01/13/19 0400 01/13/19 0419 01/13/19 0500 01/13/19 0600  BP: 123/76  139/71 (!) 141/71  Pulse: 88  89 77  Resp: (!) 30  (!) 33 (!) 22  Temp:  98.8 F (37.1 C)    TempSrc:  Oral    SpO2: 95%  (!) 87% 92%  Weight:      Height:        Intake/Output Summary (Last 24 hours) at 01/13/2019 0714 Last data filed at 01/13/2019 0600 Gross per 24 hour  Intake 1313.19 ml  Output 1325 ml  Net -11.81 ml   Filed Weights   12/27/2018 2002 01/10/19 0320  Weight: 81.6 kg 81.6 kg    Examination:  General:  Pleasantly resting in bed, No acute distress. Tolerating heated high flow cannula quite well HEENT:  Normocephalic atraumatic.  Sclerae nonicteric, noninjected.  Extraocular movements intact bilaterally. Neck:  Without mass or deformity.  Trachea is midline. Lungs: Diminished breath sounds bilaterally without overt rhonchi wheeze or rales Heart:  Regular rate and rhythm. Without murmurs, rubs, or gallops. Abdomen:  Soft, nontender, nondistended.  Without guarding or rebound. Extremities: Without cyanosis, clubbing, edema, or obvious deformity. Vascular:  Dorsalis pedis and posterior tibial pulses palpable bilaterally. Skin:  Warm and dry, no erythema, no ulcerations.  Data Reviewed: I have personally reviewed following labs and imaging studies  CBC: Recent Labs  Lab 01/10/19 0200 01/11/19 0303  01/12/19 0021 01/12/19 1238 01/13/19 0345  WBC 4.5 8.3 9.1  --  6.6  NEUTROABS 4.1 7.8* 8.6*  --  6.2  HGB 12.8* 12.6* 13.1 12.6* 12.7*  HCT 38.6* 38.5* 39.5 37.0* 36.9*  MCV 98.0 99.0 96.6  --  97.4  PLT 91* 101*  107*  --  84*   Basic Metabolic Panel: Recent Labs  Lab 01/10/19 0200 01/11/19 0303 01/12/19 0021 01/12/19 1238 01/13/19 0345  NA 138 129* 134* 133* 134*  K 4.5 4.0 4.1 4.0 4.5  CL 101 96* 100  --  100  CO2 _0 --  25  GLUCOSE 140* 169* 145*  --  181*  BUN 34* 39* 31*  --  25*  CREATININE 0.80 0.79 0.64  --  0.64  CALCIUM 8.7* 8.2* 8.4*  --  8.3*   GFR: Estimated Creatinine Clearance: 67.8 mL/min (by C-G formula based on SCr of 0.64 mg/dL). Liver Function Tests: Recent Labs  Lab 01/10/19 0200 01/11/19 0303 01/12/19 0021 01/13/19 0345  AST 93* 95* 67* 46*  ALT 67* 104* 100* 69*  ALKPHOS 46 52 74 91  BILITOT 0.9 0.9 0.9 1.3*  PROT 6.4* 5.9* 5.9* 5.6*  ALBUMIN 2.9* 2.7* 2.5* 2.6*   Recent Results (from the past 240 hour(s))  MRSA PCR Screening     Status: None   Collection Time: 01/12/19  2:34 PM   Specimen: Nasal Mucosa; Nasopharyngeal  Result Value Ref Range Status   MRSA by PCR NEGATIVE NEGATIVE Final    Comment:        The GeneXpert MRSA Assay (FDA approved for NASAL specimens only), is one component of a comprehensive MRSA colonization surveillance program. It is not intended to diagnose MRSA infection nor to guide or monitor treatment for MRSA infections. Performed at [facility], [place] [address] [place], [place] [address]      Radiology Studies: Ct Angio Chest Pe W Or Wo Contrast  Result Date: 01/12/2019 CLINICAL DATA:  Chest pain, weakness and fatigue EXAM: CT ANGIOGRAPHY CHEST WITH CONTRAST TECHNIQUE: Multidetector CT imaging of the chest was performed using the standard protocol during bolus administration of intravenous contrast. Multiplanar CT image reconstructions and MIPs were obtained to evaluate the vascular anatomy. CONTRAST:  60m OMNIPAQUE IOHEXOL 350 MG/ML SOLN COMPARISON:  Thyroid ultrasound, 12/28/2013 FINDINGS: Cardiovascular: Satisfactory opacification of the pulmonary arteries to the segmental level. No  evidence of pulmonary embolism. Mild cardiomegaly. Three-vessel coronary artery calcifications. No pericardial effusion. Aortic valve calcifications. Aortic atherosclerosis. Mediastinum/Nodes: No enlarged mediastinal, hilar, or axillary lymph nodes. Status post right thyroidectomy. Large, hypodense nodules of the left lobe of the thyroid. Trachea, and esophagus demonstrate no significant findings. Lungs/Pleura: There is very dense, confluent and consolidative ground-glass opacity throughout the lungs bilaterally, with some sparing of the lung bases and subpleural lungs throughout. There may be a discrete nodule versus small consolidation of the medial segment right middle lobe measuring 9 mm (series 5, image 108). Small bilateral pleural effusions. Upper Abdomen: No acute abnormality. Musculoskeletal: No chest wall abnormality. Disc degenerative disease and ankylosis of the thoracic spine. Review of the MIP images confirms the above findings. IMPRESSION: 1.  Negative examination for pulmonary embolism. 2. There is very dense, confluent and consolidative ground-glass opacity throughout the lungs bilaterally, with some sparing of the lung bases and subpleural lungs throughout. Findings are most consistent with multifocal infection, edema, and/or ARDS. 3. There may be a discrete nodule versus small consolidation of the medial segment right middle lobe  measuring 9 mm (series 5, image 108). Recommend follow-up CT in 3-6 months if clinically appropriate. 4.  Small bilateral pleural effusions. 5.  Coronary artery disease. 6. Aortic valve calcifications. Correlate for echocardiographic evidence of aortic valve stenosis. 7.  Aortic Atherosclerosis (ICD10-I70.0). 8. Multiple left thyroid nodules status post right thyroidectomy, consistent with goiter better assessed by prior ultrasound. Electronically Signed   By: Alex  Bibbey M.D.   On: 01/12/2019 12:55     Scheduled Meds: . bethanechol  50 mg Oral BID  . Chlorhexidine  Gluconate Cloth  6 each Topical Daily  . dexamethasone (DECADRON) injection  6 mg Intravenous Q24H  . enoxaparin (LOVENOX) injection  40 mg Subcutaneous Q12H  . finasteride  5 mg Oral Daily  . levothyroxine  88 mcg Oral QAC breakfast  . sodium chloride flush  3 mL Intravenous Q12H  . terazosin  1 mg Oral QHS  . vitamin C  500 mg Oral Daily  . zinc sulfate  220 mg Oral Daily   Continuous Infusions: . sodium chloride    . remdesivir 100 mg in NS 250 mL Stopped (01/12/19 1136)     LOS: 4 days   Time spent: 35min  William C Lancaster, DO [facility]  If 7PM-7AM, please contact night-coverage [email or web address] Password [email or web address] 01/13/2019, 7:14 AM      

## 2019-01-13 NOTE — Progress Notes (Signed)
Patient was turned to side and desated. Patient is mouth breathing. FIO2 on HHFNC increased to 100% and NRB mask added at 15 L.  Sat improved. Will titrate as able.

## 2019-01-13 NOTE — Progress Notes (Signed)
NAME:  Anthony Carey, MRN:  [id number], DOB:  September 10, 1930, LOS: 4 ADMISSION DATE:  01-31-2019, CONSULTATION DATE: 01/12/2019 REFERRING MD: Holli Humbles MD, CHIEF COMPLAINT: COVID-19 pneumonia  Brief History   85 year old admitted with COVID-19 pneumonia Transfer to ICU on 11/24 was made hypoxia and need for high flow nasal   Past Medical History  Hyperlipidemia, hypothyroidism  Significant Hospital Events   10/17-admit 1/20-transfer to ICU  Consults:  PCCM  Procedures:    Significant Diagnostic Tests:  CTA chest 01/12/2019-no pulmonary embolism, bilateral dense consolidative groundglass opacities.  Mild bilateral pleural effusion. I reviewed images personally.  Micro Data:    Antimicrobials:  Remdesivir 11/17 >  Decadron 11/17 >  Interim history/subjective:  No acute events.  Remains on heated high flow.  Objective   Blood pressure 122/71, pulse 80, temperature 98.8 F (37.1 C), temperature source Oral, resp. rate (!) 24, height 5\' 7"  (1.702 m), weight 81.6 kg, SpO2 96 %.    FiO2 (%):  [90 %-100 %] 90 %   Intake/Output Summary (Last 24 hours) at 01/13/2019 4259 Last data filed at 01/13/2019 0600 Gross per 24 hour  Intake 1073.19 ml  Output 1325 ml  Net -251.81 ml   Filed Weights   31-Jan-2019 2002 01/10/19 0320  Weight: 81.6 kg 81.6 kg    Examination: Gen:      No acute distress HEENT:  EOMI, sclera anicteric Neck:     No masses; no thyromegaly Lungs:    Clear to auscultation bilaterally; normal respiratory effort CV:         Regular rate and rhythm; no murmurs Abd:      + bowel sounds; soft, non-tender; no palpable masses, no distension Ext:    No edema; adequate peripheral perfusion Skin:      Warm and dry; no rash Neuro: Somnolent  Resolved Hospital Problem list     Assessment & Plan:  COVID-19 pneumonia, acute hypoxic respiratory failure Continue high flow nasal cannula Awake proning Continue Decadron, remdesivir  No need for intubation  at present.  We will continue to monitor closely.  Elevated D-dimer No PE on CT scan.  Continue Lovenox for prophylaxis   Best practice:  Diet: P.o. diet Pain/Anxiety/Delirium protocol (if indicated): NA VAP protocol (if indicated): NA DVT prophylaxis: Lovenox GI prophylaxis: NA Glucose control: Monitor sugars Mobility: Bed Code Status: Full Family Communication: Per primary team Disposition: ICU  Labs   CBC: Recent Labs  Lab 01/10/19 0200 01/11/19 0303 01/12/19 0021 01/12/19 1238 01/13/19 0345  WBC 4.5 8.3 9.1  --  6.6  NEUTROABS 4.1 7.8* 8.6*  --  6.2  HGB 12.8* 12.6* 13.1 12.6* 12.7*  HCT 38.6* 38.5* 39.5 37.0* 36.9*  MCV 98.0 99.0 96.6  --  97.4  PLT 91* 101* 107*  --  84*    Basic Metabolic Panel: Recent Labs  Lab 01/10/19 0200 01/11/19 0303 01/12/19 0021 01/12/19 1238 01/13/19 0345  NA 138 129* 134* 133* 134*  K 4.5 4.0 4.1 4.0 4.5  CL 101 96* 100  --  100  CO2 26 25 26   --  25  GLUCOSE 140* 169* 145*  --  181*  BUN 34* 39* 31*  --  25*  CREATININE 0.80 0.79 0.64  --  0.64  CALCIUM 8.7* 8.2* 8.4*  --  8.3*   GFR: Estimated Creatinine Clearance: 67.8 mL/min (by C-G formula based on SCr of 0.64 mg/dL). Recent Labs  Lab 01/10/19 0200 01/11/19 0303 01/12/19 0021 01/13/19 0345  WBC 4.5 8.3  9.1 6.6    Liver Function Tests: Recent Labs  Lab 01/10/19 0200 01/11/19 0303 01/12/19 0021 01/13/19 0345  AST 93* 95* 67* 46*  ALT 67* 104* 100* 69*  ALKPHOS 46 52 74 91  BILITOT 0.9 0.9 0.9 1.3*  PROT 6.4* 5.9* 5.9* 5.6*  ALBUMIN 2.9* 2.7* 2.5* 2.6*   No results for input(s): LIPASE, AMYLASE in the last 168 hours. No results for input(s): AMMONIA in the last 168 hours.  ABG    Component Value Date/Time   PHART 7.498 (H) 01/12/2019 1238   PCO2ART 31.5 (L) 01/12/2019 1238   PO2ART 39.0 (LL) 01/12/2019 1238   HCO3 24.5 01/12/2019 1238   TCO2 25 01/12/2019 1238   O2SAT 80.0 01/12/2019 1238     Coagulation Profile: No results for input(s):  INR, PROTIME in the last 168 hours.  Cardiac Enzymes: No results for input(s): CKTOTAL, CKMB, CKMBINDEX, TROPONINI in the last 168 hours.  HbA1C: No results found for: HGBA1C  CBG: No results for input(s): GLUCAP in the last 168 hours.   Critical care time: 72    The patient is critically ill with multiple organ system failure and requires high complexity decision making for assessment and support, frequent evaluation and titration of therapies, advanced monitoring, review of radiographic studies and interpretation of complex data.   Critical Care Time devoted to patient care services, exclusive of separately billable procedures, described in this note is 35 minutes.   Chilton Greathouse MD [facility] Pager [phone] If no answer call [phone] 01/13/2019, 8:38 AM

## 2019-01-13 NOTE — Progress Notes (Addendum)
CCMD and Hospitalist updated of patient's condition (increasing work of breathing with RR= 35-40"s). Patient to kept monitored as long as pt maintaining saturation as per Hospitalist. I also updated the wife. All questions were answered.

## 2019-01-13 NOTE — Progress Notes (Signed)
RT attempted to wean FIO2 without success. Patient on 25 L, 85%.

## 2019-01-13 NOTE — Plan of Care (Signed)
  Problem: Respiratory: Goal: Will maintain a patent airway Outcome: Progressing   Problem: Clinical Measurements: Goal: Cardiovascular complication will be avoided Outcome: Progressing   Problem: Elimination: Goal: Will not experience complications related to bowel motility Outcome: Progressing Goal: Will not experience complications related to urinary retention Outcome: Progressing   Problem: Pain Managment: Goal: General experience of comfort will improve Outcome: Progressing   Problem: Safety: Goal: Ability to remain free from injury will improve Outcome: Progressing

## 2019-01-14 ENCOUNTER — Inpatient Hospital Stay (HOSPITAL_COMMUNITY): Payer: Medicare Other

## 2019-01-14 DIAGNOSIS — R7989 Other specified abnormal findings of blood chemistry: Secondary | ICD-10-CM

## 2019-01-14 LAB — COMPREHENSIVE METABOLIC PANEL
ALT: 57 U/L — ABNORMAL HIGH (ref 0–44)
AST: 52 U/L — ABNORMAL HIGH (ref 15–41)
Albumin: 2.5 g/dL — ABNORMAL LOW (ref 3.5–5.0)
Alkaline Phosphatase: 141 U/L — ABNORMAL HIGH (ref 38–126)
Anion gap: 10 (ref 5–15)
BUN: 38 mg/dL — ABNORMAL HIGH (ref 8–23)
CO2: 23 mmol/L (ref 22–32)
Calcium: 8.5 mg/dL — ABNORMAL LOW (ref 8.9–10.3)
Chloride: 103 mmol/L (ref 98–111)
Creatinine, Ser: 0.84 mg/dL (ref 0.61–1.24)
GFR calc Af Amer: >60 mL/min (ref >60)
GFR calc non Af Amer: >60 mL/min (ref >60)
Glucose, Bld: 207 mg/dL — ABNORMAL HIGH (ref 70–99)
Potassium: 4.6 mmol/L (ref 3.5–5.1)
Sodium: 136 mmol/L (ref 135–145)
Total Bilirubin: 2.2 mg/dL — ABNORMAL HIGH (ref 0.3–1.2)
Total Protein: 5.7 g/dL — ABNORMAL LOW (ref 6.5–8.1)

## 2019-01-14 LAB — CBC WITH DIFFERENTIAL/PLATELET
Abs Immature Granulocytes: 0.25 10*3/uL — ABNORMAL HIGH (ref 0.00–0.07)
Basophils Absolute: 0 10*3/uL (ref 0.0–0.1)
Basophils Relative: 0 %
Eosinophils Absolute: 0 10*3/uL (ref 0.0–0.5)
Eosinophils Relative: 0 %
HCT: 39.5 % (ref 39.0–52.0)
Hemoglobin: 13.3 g/dL (ref 13.0–17.0)
Immature Granulocytes: 3 %
Lymphocytes Relative: 1 %
Lymphs Abs: 0.1 10*3/uL — ABNORMAL LOW (ref 0.7–4.0)
MCH: 32.7 pg (ref 26.0–34.0)
MCHC: 33.7 g/dL (ref 30.0–36.0)
MCV: 97.1 fL (ref 80.0–100.0)
Monocytes Absolute: 0.2 10*3/uL (ref 0.1–1.0)
Monocytes Relative: 2 %
Neutro Abs: 7.2 10*3/uL (ref 1.7–7.7)
Neutrophils Relative %: 94 %
Platelets: 63 10*3/uL — ABNORMAL LOW (ref 150–400)
RBC: 4.07 MIL/uL — ABNORMAL LOW (ref 4.22–5.81)
RDW: 13 % (ref 11.5–15.5)
WBC: 7.7 10*3/uL (ref 4.0–10.5)
nRBC: 0 % (ref 0.0–0.2)

## 2019-01-14 LAB — GLUCOSE, CAPILLARY
Glucose-Capillary: 194 mg/dL — ABNORMAL HIGH (ref 70–99)
Glucose-Capillary: 157 mg/dL — ABNORMAL HIGH (ref 70–99)
Glucose-Capillary: 148 mg/dL — ABNORMAL HIGH (ref 70–99)
Glucose-Capillary: 149 mg/dL — ABNORMAL HIGH (ref 70–99)

## 2019-01-14 LAB — POCT I-STAT 7, (LYTES, BLD GAS, ICA,H+H)
Acid-Base Excess: 2 mmol/L (ref 0.0–2.0)
Bicarbonate: 28.1 mmol/L — ABNORMAL HIGH (ref 20.0–28.0)
Calcium, Ion: 1.27 mmol/L (ref 1.15–1.40)
HCT: 36 % — ABNORMAL LOW (ref 39.0–52.0)
Hemoglobin: 12.2 g/dL — ABNORMAL LOW (ref 13.0–17.0)
O2 Saturation: 100 %
Potassium: 4.5 mmol/L (ref 3.5–5.1)
Sodium: 139 mmol/L (ref 135–145)
TCO2: 30 mmol/L (ref 22–32)
pCO2 arterial: 50.9 mmHg — ABNORMAL HIGH (ref 32.0–48.0)
pH, Arterial: 7.35 (ref 7.350–7.450)
pO2, Arterial: 253 mmHg — ABNORMAL HIGH (ref 83.0–108.0)

## 2019-01-14 LAB — D-DIMER, QUANTITATIVE: D-Dimer, Quant: >20 ug/mL-FEU — ABNORMAL HIGH (ref 0.00–0.50)

## 2019-01-14 LAB — C-REACTIVE PROTEIN: CRP: 12.4 mg/dL — ABNORMAL HIGH (ref <1.0)

## 2019-01-14 MED ORDER — MIDAZOLAM HCL 2 MG/2ML IJ SOLN
1.0000 mg | INTRAMUSCULAR | Status: DC | PRN
  Administered 2019-01-16 (×2): 1 mg via INTRAVENOUS
  Filled 2019-01-14 (×2): qty 2

## 2019-01-14 MED ORDER — FAMOTIDINE 40 MG/5ML PO SUSR
20.0000 mg | Freq: Every day | ORAL | Status: DC
  Administered 2019-01-14 – 2019-01-16 (×3): 20 mg
  Filled 2019-01-14 (×2): qty 2.5

## 2019-01-14 MED ORDER — FENTANYL CITRATE (PF) 100 MCG/2ML IJ SOLN
100.0000 ug | Freq: Once | INTRAMUSCULAR | Status: AC
  Administered 2019-01-14: 11:00:00 100 ug via INTRAVENOUS

## 2019-01-14 MED ORDER — MIDAZOLAM HCL 2 MG/2ML IJ SOLN
INTRAMUSCULAR | Status: AC
  Administered 2019-01-14: 11:00:00 2 mg via INTRAVENOUS
  Filled 2019-01-14: qty 2

## 2019-01-14 MED ORDER — ENOXAPARIN SODIUM 80 MG/0.8ML ~~LOC~~ SOLN
80.0000 mg | Freq: Two times a day (BID) | SUBCUTANEOUS | Status: DC
  Administered 2019-01-14 – 2019-01-15 (×3): 80 mg via SUBCUTANEOUS
  Filled 2019-01-14 (×4): qty 0.8

## 2019-01-14 MED ORDER — FENTANYL CITRATE (PF) 100 MCG/2ML IJ SOLN
INTRAMUSCULAR | Status: AC
  Administered 2019-01-14: 100 ug via INTRAVENOUS
  Filled 2019-01-14: qty 2

## 2019-01-14 MED ORDER — FENTANYL BOLUS VIA INFUSION
25.0000 ug | INTRAVENOUS | Status: DC | PRN
  Filled 2019-01-14: qty 25

## 2019-01-14 MED ORDER — ENOXAPARIN SODIUM 40 MG/0.4ML ~~LOC~~ SOLN
40.0000 mg | SUBCUTANEOUS | Status: AC
  Administered 2019-01-14: 12:00:00 40 mg via SUBCUTANEOUS
  Filled 2019-01-14: qty 0.4

## 2019-01-14 MED ORDER — MIDAZOLAM HCL 2 MG/2ML IJ SOLN: 1.0000 mg | INTRAMUSCULAR | Status: DC | PRN

## 2019-01-14 MED ORDER — FENTANYL CITRATE (PF) 100 MCG/2ML IJ SOLN: 100.0000 ug | Freq: Once | INTRAMUSCULAR | Status: DC | PRN

## 2019-01-14 MED ORDER — ETOMIDATE 2 MG/ML IV SOLN
INTRAVENOUS | Status: AC
  Administered 2019-01-14: 20 mg via INTRAVENOUS
  Filled 2019-01-14: qty 10

## 2019-01-14 MED ORDER — ROCURONIUM BROMIDE 10 MG/ML (PF) SYRINGE
PREFILLED_SYRINGE | INTRAVENOUS | Status: AC
  Filled 2019-01-14: qty 10

## 2019-01-14 MED ORDER — FENTANYL 2500MCG IN NS 250ML (10MCG/ML) PREMIX INFUSION
25.0000 ug/h | INTRAVENOUS | Status: DC
  Administered 2019-01-14: 75 ug/h via INTRAVENOUS
  Administered 2019-01-15: 50 ug/h via INTRAVENOUS
  Administered 2019-01-16: 125 ug/h via INTRAVENOUS
  Filled 2019-01-14 (×3): qty 250

## 2019-01-14 MED ORDER — ETOMIDATE 2 MG/ML IV SOLN
20.0000 mg | Freq: Once | INTRAVENOUS | Status: AC
  Administered 2019-01-14: 11:00:00 20 mg via INTRAVENOUS

## 2019-01-14 MED ORDER — MIDAZOLAM HCL 2 MG/2ML IJ SOLN: 2.0000 mg | Freq: Once | INTRAMUSCULAR | Status: DC | PRN

## 2019-01-14 MED ORDER — ORAL CARE MOUTH RINSE
15.0000 mL | OROMUCOSAL | Status: DC
  Administered 2019-01-14 – 2019-01-16 (×17): 15 mL via OROMUCOSAL

## 2019-01-14 MED ORDER — CHLORHEXIDINE GLUCONATE 0.12% ORAL RINSE (MEDLINE KIT)
15.0000 mL | Freq: Two times a day (BID) | OROMUCOSAL | Status: DC
  Administered 2019-01-14 – 2019-01-16 (×4): 15 mL via OROMUCOSAL

## 2019-01-14 MED ORDER — FENTANYL CITRATE (PF) 100 MCG/2ML IJ SOLN: 25.0000 ug | Freq: Once | INTRAMUSCULAR | Status: DC

## 2019-01-14 MED ORDER — MIDAZOLAM HCL 2 MG/2ML IJ SOLN
2.0000 mg | Freq: Once | INTRAMUSCULAR | Status: AC
  Administered 2019-01-14: 11:00:00 2 mg via INTRAVENOUS

## 2019-01-14 NOTE — Progress Notes (Signed)
NAME:  Anthony Carey, MRN:  [id number], DOB:  09/25/1930, LOS: 5 ADMISSION DATE:  January 30, 2019, CONSULTATION DATE: 01/12/2019 REFERRING MD: Carma Leaven MD, CHIEF COMPLAINT: COVID-19 pneumonia  Brief History   85 year old admitted with COVID-19 pneumonia Transfer to ICU on 11/24 for worsening hypoxia and need for high flow nasal  Past Medical History  Hyperlipidemia, hypothyroidism  Significant Hospital Events   12/17-admit 11/20-transfer to ICU 111/22- Fatigued and paradoxical breathing > Intuabted  Consults:  PCCM  Procedures:  ETT 11/22 >>  Significant Diagnostic Tests:  CTA chest 01/12/2019-no pulmonary embolism, bilateral dense consolidative groundglass opacities.  Mild bilateral pleural effusion. I reviewed images personally.  Micro Data:    Antimicrobials/COVID Rx  Actemra 11/20 Remdesivir 11/17 > 11/21 Decadron 11/17 >  Interim history/subjective:  Has become increasingly tachypneic with respiratory distress through the night Intubated today morning  Objective   Blood pressure 129/74, pulse (!) 104, temperature 98.2 F (36.8 C), temperature source Oral, resp. rate (!) 35, height 5\' 7"  (1.702 m), weight 81.6 kg, SpO2 (!) 89 %.    Vent Mode: PRVC FiO2 (%):  [100 %] 100 % Set Rate:  [30 bmp] 30 bmp Vt Set:  [520 mL] 520 mL PEEP:  [12 cmH20] 12 cmH20 Plateau Pressure:  [26 cmH20] 26 cmH20   Intake/Output Summary (Last 24 hours) at 01/14/2019 1118 Last data filed at 01/14/2019 0600 Gross per 24 hour  Intake 450 ml  Output 850 ml  Net -400 ml   Filed Weights   Jan 30, 2019 2002 01/10/19 0320  Weight: 81.6 kg 81.6 kg    Examination: Gen:      No acute distress HEENT:  EOMI, sclera anicteric Neck:     No masses; no thyromegaly, ETT Lungs:    Clear to auscultation bilaterally; normal respiratory effort CV:         Regular rate and rhythm; no murmurs Abd:      + bowel sounds; soft, non-tender; no palpable masses, no distension Ext:    No edema;  adequate peripheral perfusion Skin:      Warm and dry; no rash Neuro: Sedated  Resolved Hospital Problem list     Assessment & Plan:  COVID-19 pneumonia, acute hypoxic respiratory failure Intubated today for worsening resp failure ARDS net ventilation. Follow post intubation CXR, ABG Continue Decadron  B/L DVT Start full dose lovenox  Thrombocytopenia Likely secondary to critical illness Monitor CBC   Best practice:  Diet: NPO Pain/Anxiety/Delirium protocol (if indicated): Fentanyl, Versed as needed VAP protocol (if indicated): Ordered DVT prophylaxis: Full dose Lovenox GI prophylaxis: Pepcid Glucose control: Monitor sugars Mobility: Bed Code Status: Full Family Communication: Pending Disposition: ICU  Labs   CBC: Recent Labs  Lab 01/10/19 0200 01/11/19 0303 01/12/19 0021 01/12/19 1238 01/13/19 0345 01/14/19 0504  WBC 4.5 8.3 9.1  --  6.6 7.7  NEUTROABS 4.1 7.8* 8.6*  --  6.2 7.2  HGB 12.8* 12.6* 13.1 12.6* 12.7* 13.3  HCT 38.6* 38.5* 39.5 37.0* 36.9* 39.5  MCV 98.0 99.0 96.6  --  97.4 97.1  PLT 91* 101* 107*  --  84* 63*    Basic Metabolic Panel: Recent Labs  Lab 01/10/19 0200 01/11/19 0303 01/12/19 0021 01/12/19 1238 01/13/19 0345 01/14/19 0504  NA 138 129* 134* 133* 134* 136  K 4.5 4.0 4.1 4.0 4.5 4.6  CL 101 96* 100  --  100 103  CO2 26 25 26   --  25 23  GLUCOSE 140* 169* 145*  --  181* 207*  BUN 34* 39* 31*  --  25* 38*  CREATININE 0.80 0.79 0.64  --  0.64 0.84  CALCIUM 8.7* 8.2* 8.4*  --  8.3* 8.5*   GFR: Estimated Creatinine Clearance: 64.6 mL/min (by C-G formula based on SCr of 0.84 mg/dL). Recent Labs  Lab 01/11/19 0303 01/12/19 0021 01/13/19 0345 01/14/19 0504  WBC 8.3 9.1 6.6 7.7    Liver Function Tests: Recent Labs  Lab 01/10/19 0200 01/11/19 0303 01/12/19 0021 01/13/19 0345 01/14/19 0504  AST 93* 95* 67* 46* 52*  ALT 67* 104* 100* 69* 57*  ALKPHOS 46 52 74 91 141*  BILITOT 0.9 0.9 0.9 1.3* 2.2*  PROT 6.4* 5.9*  5.9* 5.6* 5.7*  ALBUMIN 2.9* 2.7* 2.5* 2.6* 2.5*   No results for input(s): LIPASE, AMYLASE in the last 168 hours. No results for input(s): AMMONIA in the last 168 hours.  ABG    Component Value Date/Time   PHART 7.498 (H) 01/12/2019 1238   PCO2ART 31.5 (L) 01/12/2019 1238   PO2ART 39.0 (LL) 01/12/2019 1238   HCO3 24.5 01/12/2019 1238   TCO2 25 01/12/2019 1238   O2SAT 80.0 01/12/2019 1238     Coagulation Profile: No results for input(s): INR, PROTIME in the last 168 hours.  Cardiac Enzymes: No results for input(s): CKTOTAL, CKMB, CKMBINDEX, TROPONINI in the last 168 hours.  HbA1C: No results found for: HGBA1C  CBG: Recent Labs  Lab 01/14/19 0845  GLUCAP 194*     Critical care time: 40    The patient is critically ill with multiple organ system failure and requires high complexity decision making for assessment and support, frequent evaluation and titration of therapies, advanced monitoring, review of radiographic studies and interpretation of complex data.   Critical Care Time devoted to patient care services, exclusive of separately billable procedures, described in this note is 35 minutes.   Marshell Garfinkel MD [facility] Pager [phone] If no answer call [phone] 01/14/2019, 11:18 AM

## 2019-01-14 NOTE — Procedures (Signed)
Intubation Procedure Note Anthony Carey [id number] Oct 09, 1930  Procedure: Intubation Indications: Airway protection and maintenance  Procedure Details Consent: Risks of procedure as well as the alternatives and risks of each were explained to the (patient/caregiver).  Consent for procedure obtained. Time Out: Verified patient identification, verified procedure, site/side was marked, verified correct patient position, special equipment/implants available, medications/allergies/relevent history reviewed, required imaging and test results available.  Performed  Maximum sterile technique was used including cap, gloves, gown, hand hygiene and mask.  Glide scope, 1 attempt  Evaluation Hemodynamic Status: BP stable throughout; O2 sats: transiently fell during during procedure and currently acceptable Patient's Current Condition: stable Complications: No apparent complications Patient did tolerate procedure well. Chest X-ray ordered to verify placement.  CXR: pending.   Anthony Carey 01/14/2019

## 2019-01-14 NOTE — Progress Notes (Signed)
VASCULAR LAB PRELIMINARY  PRELIMINARY  PRELIMINARY  PRELIMINARY  Bilateral lower extremity venous duplex completed.    Preliminary report:  See CV proc for preliminary results.  Laurey Arrow, RN results.  Edwardine Deschepper, RVT 01/14/2019, 2:02 PM

## 2019-01-14 NOTE — Plan of Care (Signed)
Patient on HFNC 100% fio and 15 L NRB overnight. Hospitalist and CCM made aware of pt's increasing work of breathing. Sats= 90-95.  Problem: Education: Goal: Knowledge of risk factors and measures for prevention of condition will improve Outcome: Progressing   Problem: Coping: Goal: Psychosocial and spiritual needs will be supported Outcome: Progressing   Problem: Education: Goal: Knowledge of General Education information will improve Description: Including pain rating scale, medication(s)/side effects and non-pharmacologic comfort measures Outcome: Progressing   Problem: Health Behavior/Discharge Planning: Goal: Ability to manage health-related needs will improve Outcome: Progressing   Problem: Clinical Measurements: Goal: Ability to maintain clinical measurements within normal limits will improve Outcome: Progressing Goal: Will remain free from infection Outcome: Progressing

## 2019-01-14 NOTE — Progress Notes (Signed)
Spoke with Wife, Son and Pts grandchildren during 61 different facetime episode this morning regarding pt status and need for intubation. Patient was able to communicate his feelings regarding upcoming decisions to be made. He was also able to express his love for each of his family members. All family in return expressed there gratitude. Pt was then intubated. Spoke with wife X 2 other episodes through out shift with her husbands status.

## 2019-01-14 NOTE — Progress Notes (Addendum)
PROGRESS NOTE    Anthony Carey  LNL:[id number] DOB: [birth year] DOA: 01/06/2019 PCP: [facility]   Brief Narrative:  Anthony Carey is a 85 y.o. male with medical history significant of hld, hypothryoid comes in with several days of worsening fatigue, sob and cough.  80s on RA on arrival, better now on 4 liters [place].  cxr with covid pna, covid pos.  Vitals stable.  Denies cp, abd pain n/v/d.  Referred for admission for covid pna causing hypoxia.  Assessment & Plan:   Principal Problem:   Pneumonia due to COVID-19 virus Active Problems:   Acute respiratory failure due to COVID-19 ([place])   HLD (hyperlipidemia)   Hypothyroidism  *Patient had markedly worsening respiratory status this morning, urgently intubated by PCCM.  Will contact family about further discussion on patient's prognosis given his acute worsening over the past 48 hours. Time will continue to follow along with PCCM if necessary -for now PCCM will take over as attending. **Update - attempted to call patient's wife x 5 times today to further discuss code status and his case. Would strongly encourage family to consider DNR now as patient would likely not benefit from ACLS/CPR should his illness cause any cardiac arrest  Acute hypoxic respiratory failure in the setting of COVID-19 pneumonia Severe ARDS criteria met -Continue to require oxygen, titrate as tolerated SpO2: 91 % O2 Flow Rate (L/min): 40 L/min FiO2 (%): 100 % -Continue dexamethasone, remdesivir -Plasma, Actemra on 01/12/2019 -Continue supportive care - inhaler, incentive spirometry, flutter -Continue heated high flow in the ICU, somewhat tachypneic this morning -Prone as tolerated Recent Labs    01/12/19 0021 01/13/19 0345 01/14/19 0504  DDIMER >20.00* >20.00* >20.00*  CRP 7.1* 14.2* 12.4*   Elevated D-dimer -CTA performed and found to be negative for PE, remarkable bilateral airspace disease concerning for this infiltrate/edema -DVT  study preliminary report positive - convert to full dose BID lovenox per pharmacy discussion.  Hyperlipidemia -Continue home medications  Ambulatory dysfunction/fall -Patient had slip/fall out of bed on 01/12/19 - awake, alert oriented, denies any symptoms -No indication for imaging   Hypothyroidism -Continue home medications  DVT prophylaxis: Lovenox BID dosing Code Status: Full Family Communication: Over the phone Disposition Plan: Currently on heated high flow in the ICU, stabilizing respiratory status over the past 24 hours, patient continues to have high risk of morbidity mortality given advanced age and comorbid conditions.  Will follow closely, disposition pending clinical course.  Subjective: Respiratory status continues to deteriorate, D-dimer markedly elevated, concerning for PE -CTA negative, placed in the ICU on heated high flow, BLE [place] positive. Otherwise declines chest pain, nausea, vomiting, headache, fevers, chills.  Objective: Vitals:   01/14/19 0515 01/14/19 0530 01/14/19 0615 01/14/19 0700  BP:   126/65 128/83  Pulse: (!) 103 96 (!) 103 88  Resp: (!) 30 (!) 28 (!) 30 (!) 24  Temp:    98.2 F (36.8 C)  TempSrc:    Oral  SpO2: 98% 98% (!) 86% 91%  Weight:      Height:        Intake/Output Summary (Last 24 hours) at 01/14/2019 0831 Last data filed at 01/14/2019 0600 Gross per 24 hour  Intake 810 ml  Output 1200 ml  Net -390 ml   Filed Weights   12/28/2018 2002 01/10/19 0320  Weight: 81.6 kg 81.6 kg    Examination:  General: Tachypneic, tachycardic, mild distress, on he did high flow nasal cannula HEENT:  Normocephalic atraumatic.  Sclerae nonicteric, noninjected.  Extraocular movements intact bilaterally. Neck:  Without mass or deformity.  Trachea is midline. Lungs: Markedly diminished breath sounds, bilateral rhonchi without overt wheeze or rales. Heart:  Regular rate and rhythm.  Without murmurs, rubs, or gallops. Abdomen:  Soft, nontender,  nondistended.  Without guarding or rebound. Extremities: Without cyanosis, clubbing, edema, or obvious deformity. Vascular:  Dorsalis pedis and posterior tibial pulses palpable bilaterally. Skin:  Warm and dry, no erythema, no ulcerations.   Data Reviewed: I have personally reviewed following labs and imaging studies  CBC: Recent Labs  Lab 01/10/19 0200 01/11/19 0303 01/12/19 0021 01/12/19 1238 01/13/19 0345 01/14/19 0504  WBC 4.5 8.3 9.1  --  6.6 7.7  NEUTROABS 4.1 7.8* 8.6*  --  6.2 7.2  HGB 12.8* 12.6* 13.1 12.6* 12.7* 13.3  HCT 38.6* 38.5* 39.5 37.0* 36.9* 39.5  MCV 98.0 99.0 96.6  --  97.4 97.1  PLT 91* 101* 107*  --  84* 63*   Basic Metabolic Panel: Recent Labs  Lab 01/10/19 0200 01/11/19 0303 01/12/19 0021 01/12/19 1238 01/13/19 0345 01/14/19 0504  NA 138 129* 134* 133* 134* 136  K 4.5 4.0 4.1 4.0 4.5 4.6  CL 101 96* 100  --  100 103  CO2 26 25 26   --  25 23  GLUCOSE 140* 169* 145*  --  181* 207*  BUN 34* 39* 31*  --  25* 38*  CREATININE 0.80 0.79 0.64  --  0.64 0.84  CALCIUM 8.7* 8.2* 8.4*  --  8.3* 8.5*   GFR: Estimated Creatinine Clearance: 64.6 mL/min (by C-G formula based on SCr of 0.84 mg/dL). Liver Function Tests: Recent Labs  Lab 01/10/19 0200 01/11/19 0303 01/12/19 0021 01/13/19 0345 01/14/19 0504  AST 93* 95* 67* 46* 52*  ALT 67* 104* 100* 69* 57*  ALKPHOS 46 52 74 91 141*  BILITOT 0.9 0.9 0.9 1.3* 2.2*  PROT 6.4* 5.9* 5.9* 5.6* 5.7*  ALBUMIN 2.9* 2.7* 2.5* 2.6* 2.5*   Recent Results (from the past 240 hour(s))  MRSA PCR Screening     Status: None   Collection Time: 01/12/19  2:34 PM   Specimen: Nasal Mucosa; Nasopharyngeal  Result Value Ref Range Status   MRSA by PCR NEGATIVE NEGATIVE Final    Comment:        The GeneXpert MRSA Assay (FDA approved for NASAL specimens only), is one component of a comprehensive MRSA colonization surveillance program. It is not intended to diagnose MRSA infection nor to guide or monitor treatment  for MRSA infections. Performed at [facility], [place] [address], [place], [place] [address]      Radiology Studies: Ct Angio Chest Pe W Or Wo Contrast  Result Date: 01/12/2019 CLINICAL DATA:  Chest pain, weakness and fatigue EXAM: CT ANGIOGRAPHY CHEST WITH CONTRAST TECHNIQUE: Multidetector CT imaging of the chest was performed using the standard protocol during bolus administration of intravenous contrast. Multiplanar CT image reconstructions and MIPs were obtained to evaluate the vascular anatomy. CONTRAST:  74m OMNIPAQUE IOHEXOL 350 MG/ML SOLN COMPARISON:  Thyroid ultrasound, 12/28/2013 FINDINGS: Cardiovascular: Satisfactory opacification of the pulmonary arteries to the segmental level. No evidence of pulmonary embolism. Mild cardiomegaly. Three-vessel coronary artery calcifications. No pericardial effusion. Aortic valve calcifications. Aortic atherosclerosis. Mediastinum/Nodes: No enlarged mediastinal, hilar, or axillary lymph nodes. Status post right thyroidectomy. Large, hypodense nodules of the left lobe of the thyroid. Trachea, and esophagus demonstrate no significant findings. Lungs/Pleura: There is very dense, confluent and consolidative ground-glass opacity throughout the lungs bilaterally, with some  sparing of the lung bases and subpleural lungs throughout. There may be a discrete nodule versus small consolidation of the medial segment right middle lobe measuring 9 mm (series 5, image 108). Small bilateral pleural effusions. Upper Abdomen: No acute abnormality. Musculoskeletal: No chest wall abnormality. Disc degenerative disease and ankylosis of the thoracic spine. Review of the MIP images confirms the above findings. IMPRESSION: 1.  Negative examination for pulmonary embolism. 2. There is very dense, confluent and consolidative ground-glass opacity throughout the lungs bilaterally, with some sparing of the lung bases and subpleural lungs throughout. Findings are most  consistent with multifocal infection, edema, and/or ARDS. 3. There may be a discrete nodule versus small consolidation of the medial segment right middle lobe measuring 9 mm (series 5, image 108). Recommend follow-up CT in 3-6 months if clinically appropriate. 4.  Small bilateral pleural effusions. 5.  Coronary artery disease. 6. Aortic valve calcifications. Correlate for echocardiographic evidence of aortic valve stenosis. 7.  Aortic Atherosclerosis (ICD10-I70.0). 8. Multiple left thyroid nodules status post right thyroidectomy, consistent with goiter better assessed by prior ultrasound. Electronically Signed   By: Eddie Candle M.D.   On: 01/12/2019 12:55     Scheduled Meds:  bethanechol  50 mg Oral BID   Chlorhexidine Gluconate Cloth  6 each Topical Daily   dexamethasone (DECADRON) injection  6 mg Intravenous Q24H   enoxaparin (LOVENOX) injection  40 mg Subcutaneous Q12H   finasteride  5 mg Oral Daily   levothyroxine  88 mcg Oral QAC breakfast   sodium chloride flush  3 mL Intravenous Q12H   terazosin  1 mg Oral QHS   vitamin C  500 mg Oral Daily   zinc sulfate  220 mg Oral Daily   Continuous Infusions:  sodium chloride       LOS: 5 days   Time spent: 70mn  Cherell Colvin C Cali Cuartas, DO [facility]  If 7PM-7AM, please contact night-coverage [email or web address] Password [email or web address] ([facility]01/14/2019, 8:31 AM

## 2019-01-14 NOTE — Progress Notes (Signed)
[place] for Enoxaparin Indication: DVT  Allergies  Allergen Reactions  . Avodart [Dutasteride] Other (See Comments)    Joint pain  . Contrast Media [Iodinated Diagnostic Agents] Other (See Comments)    Severe weakness and fatigue  . Lipitor [Atorvastatin] Other (See Comments)    Joint pain, weakness    Patient Measurements: Height: 5\' 7"  (170.2 cm) Weight: 179 lb 14.3 oz (81.6 kg) IBW/kg (Calculated) : 66.1 Heparin Dosing Weight:   Vital Signs: Temp: 98.2 F (36.8 C) (11/22 0700) Temp Source: Oral (11/22 0700) BP: 129/74 (11/22 0834) Pulse Rate: 104 (11/22 0834)  Labs: Recent Labs    01/12/19 0021 01/12/19 1238 01/13/19 0345 01/14/19 0504  HGB 13.1 12.6* 12.7* 13.3  HCT 39.5 37.0* 36.9* 39.5  PLT 107*  --  84* 63*  CREATININE 0.64  --  0.64 0.84    Estimated Creatinine Clearance: 64.6 mL/min (by C-G formula based on SCr of 0.84 mg/dL).   Medications:  Scheduled:  . bethanechol  50 mg Oral BID  . Chlorhexidine Gluconate Cloth  6 each Topical Daily  . dexamethasone (DECADRON) injection  6 mg Intravenous Q24H  . enoxaparin (LOVENOX) injection  40 mg Subcutaneous STAT  . enoxaparin (LOVENOX) injection  80 mg Subcutaneous Q12H  . etomidate      . fentaNYL      . finasteride  5 mg Oral Daily  . levothyroxine  88 mcg Oral QAC breakfast  . midazolam      . sodium chloride flush  3 mL Intravenous Q12H  . terazosin  1 mg Oral QHS  . vitamin C  500 mg Oral Daily  . zinc sulfate  220 mg Oral Daily   Infusions:  . sodium chloride      Assessment: 58 yoM admitted on 11/17 with COVID-19 pneumonia.  He has been on enoxaparin VTE prophylaxis since admission (Daily from 11/17-11/20, increased to BID prophylaxis dosing for ICU admission starting 11/20). Pharmacy is now consulted to increase to full treatment dose enoxaparin for acute DVT. 11/20 CTa: negative for PE CrCl > 30 ml/min CBC: Hgb stable, but Platelets decreasing  (91>101>107>84>63) No bleeding or complications reported.  Goal of Therapy:  Anti-Xa level 0.6-1 units/ml 4hrs after LMWH dose given Monitor platelets by anticoagulation protocol: Yes   Plan:  Lovenox 80 mg [place] q12h. Continue to monitor renal function, CBC, Plt, and s/s bleeding.  Gretta Arab PharmD, BCPS Clinical pharmacist phone 7am- 5pm: [phone] 01/14/2019 10:52 AM

## 2019-01-15 ENCOUNTER — Inpatient Hospital Stay (HOSPITAL_COMMUNITY): Payer: Medicare Other

## 2019-01-15 DIAGNOSIS — J80 Acute respiratory distress syndrome: Secondary | ICD-10-CM

## 2019-01-15 DIAGNOSIS — N179 Acute kidney failure, unspecified: Secondary | ICD-10-CM

## 2019-01-15 DIAGNOSIS — J9600 Acute respiratory failure, unspecified whether with hypoxia or hypercapnia: Secondary | ICD-10-CM

## 2019-01-15 LAB — CBC WITH DIFFERENTIAL/PLATELET
Abs Immature Granulocytes: 0.29 10*3/uL — ABNORMAL HIGH (ref 0.00–0.07)
Basophils Absolute: 0 10*3/uL (ref 0.0–0.1)
Basophils Relative: 0 %
Eosinophils Absolute: 0 10*3/uL (ref 0.0–0.5)
Eosinophils Relative: 0 %
HCT: 38.1 % — ABNORMAL LOW (ref 39.0–52.0)
Hemoglobin: 12.6 g/dL — ABNORMAL LOW (ref 13.0–17.0)
Immature Granulocytes: 5 %
Lymphocytes Relative: 1 %
Lymphs Abs: 0.1 10*3/uL — ABNORMAL LOW (ref 0.7–4.0)
MCH: 33.8 pg (ref 26.0–34.0)
MCHC: 33.1 g/dL (ref 30.0–36.0)
MCV: 102.1 fL — ABNORMAL HIGH (ref 80.0–100.0)
Monocytes Absolute: 0.2 10*3/uL (ref 0.1–1.0)
Monocytes Relative: 3 %
Neutro Abs: 5.3 10*3/uL (ref 1.7–7.7)
Neutrophils Relative %: 91 %
Platelets: 46 10*3/uL — ABNORMAL LOW (ref 150–400)
RBC: 3.73 MIL/uL — ABNORMAL LOW (ref 4.22–5.81)
RDW: 14.1 % (ref 11.5–15.5)
WBC: 5.9 10*3/uL (ref 4.0–10.5)
nRBC: 0 % (ref 0.0–0.2)

## 2019-01-15 LAB — COMPREHENSIVE METABOLIC PANEL
ALT: 54 U/L — ABNORMAL HIGH (ref 0–44)
AST: 82 U/L — ABNORMAL HIGH (ref 15–41)
Albumin: 2.3 g/dL — ABNORMAL LOW (ref 3.5–5.0)
Alkaline Phosphatase: 96 U/L (ref 38–126)
Anion gap: 8 (ref 5–15)
BUN: 64 mg/dL — ABNORMAL HIGH (ref 8–23)
CO2: 26 mmol/L (ref 22–32)
Calcium: 8.3 mg/dL — ABNORMAL LOW (ref 8.9–10.3)
Chloride: 109 mmol/L (ref 98–111)
Creatinine, Ser: 1.07 mg/dL (ref 0.61–1.24)
GFR calc Af Amer: >60 mL/min (ref >60)
GFR calc non Af Amer: >60 mL/min (ref >60)
Glucose, Bld: 207 mg/dL — ABNORMAL HIGH (ref 70–99)
Potassium: 5.1 mmol/L (ref 3.5–5.1)
Sodium: 143 mmol/L (ref 135–145)
Total Bilirubin: 1.1 mg/dL (ref 0.3–1.2)
Total Protein: 5.3 g/dL — ABNORMAL LOW (ref 6.5–8.1)

## 2019-01-15 LAB — POCT I-STAT 7, (LYTES, BLD GAS, ICA,H+H)
Acid-Base Excess: 2 mmol/L (ref 0.0–2.0)
Bicarbonate: 27.3 mmol/L (ref 20.0–28.0)
Calcium, Ion: 1.26 mmol/L (ref 1.15–1.40)
HCT: 36 % — ABNORMAL LOW (ref 39.0–52.0)
Hemoglobin: 12.2 g/dL — ABNORMAL LOW (ref 13.0–17.0)
O2 Saturation: 96 %
Patient temperature: 98.7
Potassium: 4.9 mmol/L (ref 3.5–5.1)
Sodium: 141 mmol/L (ref 135–145)
TCO2: 29 mmol/L (ref 22–32)
pCO2 arterial: 46 mmHg (ref 32.0–48.0)
pH, Arterial: 7.381 (ref 7.350–7.450)
pO2, Arterial: 88 mmHg (ref 83.0–108.0)

## 2019-01-15 LAB — PHOSPHORUS
Phosphorus: 4.3 mg/dL (ref 2.5–4.6)
Phosphorus: 3.9 mg/dL (ref 2.5–4.6)

## 2019-01-15 LAB — GLUCOSE, CAPILLARY
Glucose-Capillary: 154 mg/dL — ABNORMAL HIGH (ref 70–99)
Glucose-Capillary: 157 mg/dL — ABNORMAL HIGH (ref 70–99)
Glucose-Capillary: 159 mg/dL — ABNORMAL HIGH (ref 70–99)
Glucose-Capillary: 179 mg/dL — ABNORMAL HIGH (ref 70–99)
Glucose-Capillary: 195 mg/dL — ABNORMAL HIGH (ref 70–99)
Glucose-Capillary: 207 mg/dL — ABNORMAL HIGH (ref 70–99)

## 2019-01-15 LAB — C-REACTIVE PROTEIN: CRP: 14.3 mg/dL — ABNORMAL HIGH (ref <1.0)

## 2019-01-15 LAB — D-DIMER, QUANTITATIVE: D-Dimer, Quant: >20 ug/mL-FEU — ABNORMAL HIGH (ref 0.00–0.50)

## 2019-01-15 LAB — MAGNESIUM
Magnesium: 2.5 mg/dL — ABNORMAL HIGH (ref 1.7–2.4)
Magnesium: 2.7 mg/dL — ABNORMAL HIGH (ref 1.7–2.4)

## 2019-01-15 MED ORDER — INSULIN ASPART 100 UNIT/ML ~~LOC~~ SOLN
0.0000 [IU] | SUBCUTANEOUS | Status: DC
  Administered 2019-01-15 – 2019-01-16 (×2): 3 [IU] via SUBCUTANEOUS

## 2019-01-15 MED ORDER — PRO-STAT SUGAR FREE PO LIQD
30.0000 mL | Freq: Two times a day (BID) | ORAL | Status: DC
  Administered 2019-01-15: 30 mL
  Filled 2019-01-15: qty 30

## 2019-01-15 MED ORDER — VITAL AF 1.2 CAL PO LIQD
1000.0000 mL | ORAL | Status: DC
  Administered 2019-01-15 – 2019-01-16 (×2): 1000 mL

## 2019-01-15 MED ORDER — VITAL HIGH PROTEIN PO LIQD
1000.0000 mL | ORAL | Status: DC
  Administered 2019-01-15: 1000 mL
  Filled 2019-01-15: qty 1000

## 2019-01-15 NOTE — Progress Notes (Addendum)
Updated wife of pt's condition. All her questions were answered. Titrated fentanyl as pt das per [facility]AS as Pt started waking up and was  de synchronus with the ventilator with episodes of desaturation to 78%.

## 2019-01-15 NOTE — Progress Notes (Signed)
Initial Nutrition Assessment RD working remotely.  DOCUMENTATION CODES:   Not applicable  INTERVENTION:    Vital AF 1.2 at 70 ml/h (1680 ml per day)   Provides 2016 kcal, 126 gm protein, 1362 ml free water daily  NUTRITION DIAGNOSIS:   Increased nutrient needs related to acute illness(COVID PNA) as evidenced by estimated needs.  GOAL:   Patient will meet greater than or equal to 90% of their needs  MONITOR:   TF tolerance, Vent status, Skin, Labs  REASON FOR ASSESSMENT:   Ventilator, Consult Enteral/tube feeding initiation and management  ASSESSMENT:   85 yo male admitted with COVID-19 PNA. Patient was transferred to the ICU 11/20 and intubated on 11/22 due to worsening respiratory status. PMH includes HLD, hypothyroidism.   OG tube in place. Received MD Consult for TF initiation and management.  Patient is currently intubated on ventilator support MV: 15.8 L/min Temp (24hrs), Avg:99.1 F (37.3 C), Min:98.2 F (36.8 C), Max:100 F (37.8 C)   Labs reviewed. Magnesium 2.5 (H)  CBG's: 159-154  Medications reviewed and include decadron, vitamin C, zinc sulfate.  No recent weight encounters available for review.   NUTRITION - FOCUSED PHYSICAL EXAM:  deferred  Diet Order:   Diet Order    None      EDUCATION NEEDS:   Not appropriate for education at this time  Skin:  Skin Assessment: Skin Integrity Issues: Skin Integrity Issues:: Stage I Stage I: Left ischial tuberosity  Last BM:  11/16  Height:   Ht Readings from Last 1 Encounters:  01/15/19 5\' 7"  (1.702 m)    Weight:   Wt Readings from Last 1 Encounters:  01/10/19 81.6 kg    Ideal Body Weight:  67.3 kg  BMI:  Body mass index is 28.18 kg/m.  Estimated Nutritional Needs:   Kcal:  1995  Protein:  115-130 gm  Fluid:  >/= 2 L    Molli Barrows, RD, LDN, [place] Pager [phone] After Hours Pager [phone]

## 2019-01-15 NOTE — Progress Notes (Signed)
Spoke with wife and updated on care again

## 2019-01-15 NOTE — Progress Notes (Signed)
Spoke with wife and updated on care.

## 2019-01-15 NOTE — Progress Notes (Signed)
NAME:  Anthony Carey, MRN:  [id number], DOB:  1930/05/03, LOS: 6 ADMISSION DATE:  12/26/2018, CONSULTATION DATE:  11/23 REFERRING MD:  Natale Milch, CHIEF COMPLAINT:  Dyspnea   Brief History   85 year old admitted with COVID-19 pneumonia Transfer to ICU on 11/24 for worsening hypoxia and need for high flow nasal Intubated on 11/22   Past Medical History  Hyperlipidemia, hypothyroidism  Significant Hospital Events   12/17-admit 11/20-transfer to ICU 111/22- Fatigued and paradoxical breathing > Intuabted  Consults:  PCCM  Procedures:  ETT 11/22 >>  Significant Diagnostic Tests:  11/22 acute bilat DVT  Micro Data:  COVID 11/17? positive  Antimicrobials:  Actemra 11/20 Remdesivir 11/17 > 11/21 Decadron 11/17 >   Interim history/subjective:  Intubated yesterday Slightly worse renal function Minimal vent settings  Objective   Blood pressure (!) 97/53, pulse 74, temperature 100 F (37.8 C), temperature source Oral, resp. rate (!) 27, height 5\' 7"  (1.702 m), weight 81.6 kg, SpO2 100 %.    Vent Mode: PRVC FiO2 (%):  [40 %-100 %] 40 % Set Rate:  [30 bmp] 30 bmp Vt Set:  [520 mL] 520 mL PEEP:  [8 cmH20-12 cmH20] 8 cmH20 Plateau Pressure:  [16 cmH20-27 cmH20] 16 cmH20   Intake/Output Summary (Last 24 hours) at 01/15/2019 1050 Last data filed at 01/15/2019 01/17/2019 Gross per 24 hour  Intake 371.66 ml  Output 975 ml  Net -603.34 ml   Filed Weights   01/04/2019 2002 01/10/19 0320  Weight: 81.6 kg 81.6 kg    Examination:  General:  In bed on vent HENT: NCAT ETT in place PULM: CTA B, vent supported breathing CV: RRR, no mgr GI: BS+, soft, nontender MSK: normal bulk and tone Neuro: sedated on vent    Resolved Hospital Problem list     Assessment & Plan:  ARDS Continue mechanical ventilation per ARDS protocol Target TVol 6-8cc/kgIBW Target Plateau Pressure < 30cm H20 Target driving pressure less than 15 cm of water Target PaO2 55-65: titrate PEEP/FiO2 per  protocol As long as PaO2 to FiO2 ratio is less than 1:150 position in prone position for 16 hours a day Check CVP daily if CVL in place Target CVP less than 4, diurese as necessary Ventilator associated pneumonia prevention protocol  Thrombocytopenia, likely due to COVID Monitor for bleeding Continue lovenox  Slightly worse renal function Monitor BMET and UOP Replace electrolytes as needed  DVT, new acute lovenox to continue Monitor for bleeding  Need for sedation for mechanical ventilation: RASS target -2 Fentanyl infusion, versed prn PAD protocol   Best practice:  Diet: tube feeding Pain/Anxiety/Delirium protocol (if indicated): as above VAP protocol (if indicated): yes DVT prophylaxis: lovenox GI prophylaxis: famotidine Glucose control: SSI Mobility: bed rest Code Status: full Family Communication: I called his wife, had to leave a message; I did speak to his son and advised we need to limit duration on ventilator.  He says his ffamily is discussing this Disposition: remain in ICU  Labs   CBC: Recent Labs  Lab 01/11/19 0303 01/12/19 0021  01/13/19 0345 01/14/19 0504 01/14/19 1329 01/15/19 0400 01/15/19 0455  WBC 8.3 9.1  --  6.6 7.7  --   --  5.9  NEUTROABS 7.8* 8.6*  --  6.2 7.2  --   --  5.3  HGB 12.6* 13.1   < > 12.7* 13.3 12.2* 12.2* 12.6*  HCT 38.5* 39.5   < > 36.9* 39.5 36.0* 36.0* 38.1*  MCV 99.0 96.6  --  97.4 97.1  --   --  102.1*  PLT 101* 107*  --  84* 63*  --   --  46*   < > = values in this interval not displayed.    Basic Metabolic Panel: Recent Labs  Lab 01/11/19 0303 01/12/19 0021  01/13/19 0345 01/14/19 0504 01/14/19 1329 01/15/19 0400 01/15/19 0455  NA 129* 134*   < > 134* 136 139 141 143  K 4.0 4.1   < > 4.5 4.6 4.5 4.9 5.1  CL 96* 100  --  100 103  --   --  109  CO2 25 26  --  25 23  --   --  26  GLUCOSE 169* 145*  --  181* 207*  --   --  207*  BUN 39* 31*  --  25* 38*  --   --  64*  CREATININE 0.79 0.64  --  0.64 0.84  --    --  1.07  CALCIUM 8.2* 8.4*  --  8.3* 8.5*  --   --  8.3*  MG  --   --   --   --   --   --   --  2.5*  PHOS  --   --   --   --   --   --   --  4.3   < > = values in this interval not displayed.   GFR: Estimated Creatinine Clearance: 50.7 mL/min (by C-G formula based on SCr of 1.07 mg/dL). Recent Labs  Lab 01/12/19 0021 01/13/19 0345 01/14/19 0504 01/15/19 0455  WBC 9.1 6.6 7.7 5.9    Liver Function Tests: Recent Labs  Lab 01/11/19 0303 01/12/19 0021 01/13/19 0345 01/14/19 0504 01/15/19 0455  AST 95* 67* 46* 52* 82*  ALT 104* 100* 69* 57* 54*  ALKPHOS 52 74 91 141* 96  BILITOT 0.9 0.9 1.3* 2.2* 1.1  PROT 5.9* 5.9* 5.6* 5.7* 5.3*  ALBUMIN 2.7* 2.5* 2.6* 2.5* 2.3*   No results for input(s): LIPASE, AMYLASE in the last 168 hours. No results for input(s): AMMONIA in the last 168 hours.  ABG    Component Value Date/Time   PHART 7.381 01/15/2019 0400   PCO2ART 46.0 01/15/2019 0400   PO2ART 88.0 01/15/2019 0400   HCO3 27.3 01/15/2019 0400   TCO2 29 01/15/2019 0400   O2SAT 96.0 01/15/2019 0400     Coagulation Profile: No results for input(s): INR, PROTIME in the last 168 hours.  Cardiac Enzymes: No results for input(s): CKTOTAL, CKMB, CKMBINDEX, TROPONINI in the last 168 hours.  HbA1C: No results found for: HGBA1C  CBG: Recent Labs  Lab 01/14/19 0845 01/14/19 1153 01/14/19 1736 01/14/19 2025 01/15/19 0021  GLUCAP 194* 157* 148* 149* 157*     Critical care time: 35 minutes     Roselie Awkward, MD [place] [facility] Pager: [phone] Cell: [phone] If no response, call [phone]

## 2019-01-16 ENCOUNTER — Inpatient Hospital Stay (HOSPITAL_COMMUNITY): Payer: Medicare Other

## 2019-01-16 DIAGNOSIS — L8990 Pressure ulcer of unspecified site, unspecified stage: Secondary | ICD-10-CM | POA: Insufficient documentation

## 2019-01-16 LAB — CBC WITH DIFFERENTIAL/PLATELET
Abs Immature Granulocytes: 0.24 10*3/uL — ABNORMAL HIGH (ref 0.00–0.07)
Basophils Absolute: 0 10*3/uL (ref 0.0–0.1)
Basophils Relative: 0 %
Eosinophils Absolute: 0 10*3/uL (ref 0.0–0.5)
Eosinophils Relative: 0 %
HCT: 40.2 % (ref 39.0–52.0)
Hemoglobin: 12.8 g/dL — ABNORMAL LOW (ref 13.0–17.0)
Immature Granulocytes: 3 %
Lymphocytes Relative: 1 %
Lymphs Abs: 0.1 10*3/uL — ABNORMAL LOW (ref 0.7–4.0)
MCH: 33.2 pg (ref 26.0–34.0)
MCHC: 31.8 g/dL (ref 30.0–36.0)
MCV: 104.4 fL — ABNORMAL HIGH (ref 80.0–100.0)
Monocytes Absolute: 0.3 10*3/uL (ref 0.1–1.0)
Monocytes Relative: 3 %
Neutro Abs: 7.5 10*3/uL (ref 1.7–7.7)
Neutrophils Relative %: 93 %
Platelets: 40 10*3/uL — ABNORMAL LOW (ref 150–400)
RBC: 3.85 MIL/uL — ABNORMAL LOW (ref 4.22–5.81)
RDW: 13.6 % (ref 11.5–15.5)
WBC: 8.1 10*3/uL (ref 4.0–10.5)
nRBC: 0 % (ref 0.0–0.2)

## 2019-01-16 LAB — BASIC METABOLIC PANEL
Anion gap: 8 (ref 5–15)
BUN: 74 mg/dL — ABNORMAL HIGH (ref 8–23)
CO2: 27 mmol/L (ref 22–32)
Calcium: 8 mg/dL — ABNORMAL LOW (ref 8.9–10.3)
Chloride: 110 mmol/L (ref 98–111)
Creatinine, Ser: 1.15 mg/dL (ref 0.61–1.24)
GFR calc Af Amer: >60 mL/min (ref >60)
GFR calc non Af Amer: 57 mL/min — ABNORMAL LOW (ref >60)
Glucose, Bld: 256 mg/dL — ABNORMAL HIGH (ref 70–99)
Potassium: 4.9 mmol/L (ref 3.5–5.1)
Sodium: 145 mmol/L (ref 135–145)

## 2019-01-16 LAB — GLUCOSE, CAPILLARY
Glucose-Capillary: 217 mg/dL — ABNORMAL HIGH (ref 70–99)
Glucose-Capillary: 247 mg/dL — ABNORMAL HIGH (ref 70–99)

## 2019-01-16 LAB — MAGNESIUM: Magnesium: 2.6 mg/dL — ABNORMAL HIGH (ref 1.7–2.4)

## 2019-01-16 LAB — PHOSPHORUS: Phosphorus: 4 mg/dL (ref 2.5–4.6)

## 2019-01-16 MED ORDER — VITAMIN C 500 MG PO TABS
500.0000 mg | ORAL_TABLET | Freq: Every day | ORAL | Status: DC
  Administered 2019-01-16: 500 mg
  Filled 2019-01-16: qty 1

## 2019-01-16 MED ORDER — INSULIN ASPART 100 UNIT/ML ~~LOC~~ SOLN: 0.0000 [IU] | SUBCUTANEOUS | Status: DC

## 2019-01-16 MED ORDER — ZINC SULFATE 220 (50 ZN) MG PO CAPS
220.0000 mg | ORAL_CAPSULE | Freq: Every day | ORAL | Status: DC
  Administered 2019-01-16: 220 mg
  Filled 2019-01-16: qty 1

## 2019-01-16 MED ORDER — BETHANECHOL CHLORIDE 25 MG PO TABS
50.0000 mg | ORAL_TABLET | Freq: Two times a day (BID) | ORAL | Status: DC
  Administered 2019-01-16: 50 mg
  Filled 2019-01-16 (×2): qty 2

## 2019-01-16 MED ORDER — FREE WATER: 200.0000 mL | Freq: Three times a day (TID) | Status: DC

## 2019-01-16 MED ORDER — LEVOTHYROXINE SODIUM 88 MCG PO TABS
88.0000 ug | ORAL_TABLET | Freq: Every day | ORAL | Status: DC
  Filled 2019-01-16: qty 1

## 2019-01-16 MED ORDER — GLYCOPYRROLATE 1 MG PO TABS
1.0000 mg | ORAL_TABLET | ORAL | Status: DC | PRN
  Filled 2019-01-16: qty 1

## 2019-01-16 MED ORDER — DIPHENHYDRAMINE HCL 50 MG/ML IJ SOLN: 25.0000 mg | INTRAMUSCULAR | Status: DC | PRN

## 2019-01-16 MED ORDER — ACETAMINOPHEN 650 MG RE SUPP: 650.0000 mg | Freq: Four times a day (QID) | RECTAL | Status: DC | PRN

## 2019-01-16 MED ORDER — TERAZOSIN HCL 1 MG PO CAPS
1.0000 mg | ORAL_CAPSULE | Freq: Every day | ORAL | Status: DC
  Filled 2019-01-16: qty 1

## 2019-01-16 MED ORDER — GLYCOPYRROLATE 0.2 MG/ML IJ SOLN: 0.2000 mg | INTRAMUSCULAR | Status: DC | PRN

## 2019-01-16 MED ORDER — DEXTROSE 5 % IV SOLN: INTRAVENOUS | Status: DC

## 2019-01-16 MED ORDER — INSULIN ASPART 100 UNIT/ML ~~LOC~~ SOLN
0.0000 [IU] | SUBCUTANEOUS | Status: DC
  Administered 2019-01-16: 7 [IU] via SUBCUTANEOUS

## 2019-01-16 MED ORDER — POLYVINYL ALCOHOL 1.4 % OP SOLN
1.0000 [drp] | Freq: Four times a day (QID) | OPHTHALMIC | Status: DC | PRN
  Filled 2019-01-16: qty 15

## 2019-01-16 MED ORDER — INSULIN ASPART 100 UNIT/ML ~~LOC~~ SOLN: 4.0000 [IU] | SUBCUTANEOUS | Status: DC

## 2019-01-16 MED ORDER — ACETAMINOPHEN 325 MG PO TABS: 650.0000 mg | ORAL_TABLET | Freq: Four times a day (QID) | ORAL | Status: DC | PRN

## 2019-01-23 NOTE — Progress Notes (Signed)
Patient time of death at 20.  Pronounced by Myself and Corene Cornea.  Family was on face time at time of passing.

## 2019-01-23 NOTE — Progress Notes (Signed)
Pt transported to family visit room to visit with family prior to withdrawal of care. Pt stable throughout with no complications. VS within normal limits. RT will continue to monitor

## 2019-01-23 NOTE — Death Summary Note (Signed)
DEATH SUMMARY   Patient Details  Name: Anthony Carey MRN: [id number] DOB: 1930/02/24  Admission/Discharge Information   Admit Date:  02/02/19  Date of Death: Date of Death: 02/02/2019  Time of Death: Time of Death: 07/03/33  Length of Stay: 7  Referring Physician: [facility]   Reason(s) for Hospitalization  COVID 19  Diagnoses  Preliminary cause of death:   COVID Jul 05, 2022 Secondary Diagnoses (including complications and co-morbidities):  Principal Problem:   Pneumonia due to COVID-19 virus Active Problems:   Acute respiratory failure due to COVID-19 (HCC)   HLD (hyperlipidemia)   Hypothyroidism   Pressure injury of skin   Brief Hospital Course (including significant findings, care, treatment, and services provided and events leading to death)  Anthony Carey is a 85 y.o. year old male who was admitted with ARDS due to COVID 19 pneumonia.  He was treated with high flow oxygen until his condition worsened then he needed to be intubated for mechanical ventilatory support.  He stated at the time of intubation he only wanted to be intubated for 2-3 days.  On ventilator day three his pneumonia and ventilator settings had worsened and he had a pneumothorax.  His family elected to make him comfort measures at this point.  He died peacefully after a family visit.    Pertinent Labs and Studies  Significant Diagnostic Studies Ct Angio Chest Pe W Or Wo Contrast  Result Date: 01/12/2019 CLINICAL DATA:  Chest pain, weakness and fatigue EXAM: CT ANGIOGRAPHY CHEST WITH CONTRAST TECHNIQUE: Multidetector CT imaging of the chest was performed using the standard protocol during bolus administration of intravenous contrast. Multiplanar CT image reconstructions and MIPs were obtained to evaluate the vascular anatomy. CONTRAST:  71mL OMNIPAQUE IOHEXOL 350 MG/ML SOLN COMPARISON:  Thyroid ultrasound, 12/28/2013 FINDINGS: Cardiovascular: Satisfactory opacification of the pulmonary arteries to  the segmental level. No evidence of pulmonary embolism. Mild cardiomegaly. Three-vessel coronary artery calcifications. No pericardial effusion. Aortic valve calcifications. Aortic atherosclerosis. Mediastinum/Nodes: No enlarged mediastinal, hilar, or axillary lymph nodes. Status post right thyroidectomy. Large, hypodense nodules of the left lobe of the thyroid. Trachea, and esophagus demonstrate no significant findings. Lungs/Pleura: There is very dense, confluent and consolidative ground-glass opacity throughout the lungs bilaterally, with some sparing of the lung bases and subpleural lungs throughout. There may be a discrete nodule versus small consolidation of the medial segment right middle lobe measuring 9 mm (series 5, image 108). Small bilateral pleural effusions. Upper Abdomen: No acute abnormality. Musculoskeletal: No chest wall abnormality. Disc degenerative disease and ankylosis of the thoracic spine. Review of the MIP images confirms the above findings. IMPRESSION: 1.  Negative examination for pulmonary embolism. 2. There is very dense, confluent and consolidative ground-glass opacity throughout the lungs bilaterally, with some sparing of the lung bases and subpleural lungs throughout. Findings are most consistent with multifocal infection, edema, and/or ARDS. 3. There may be a discrete nodule versus small consolidation of the medial segment right middle lobe measuring 9 mm (series 5, image 108). Recommend follow-up CT in 3-6 months if clinically appropriate. 4.  Small bilateral pleural effusions. 5.  Coronary artery disease. 6. Aortic valve calcifications. Correlate for echocardiographic evidence of aortic valve stenosis. 7.  Aortic Atherosclerosis (ICD10-I70.0). 8. Multiple left thyroid nodules status post right thyroidectomy, consistent with goiter better assessed by prior ultrasound. Electronically Signed   By: Lauralyn Primes M.D.   On: 01/12/2019 12:55   Dg Chest Port 1 View  Result Date:  01/22/2019 CLINICAL DATA:  85 year old male with  hypoxia. EXAM: PORTABLE CHEST 1 VIEW COMPARISON:  Chest radiograph dated 01/15/2019. FINDINGS: Interval development of a right-sided pneumothorax measuring approximately 6 cm to the right apex and 2 cm to the lateral pleural surface. Diffuse bilateral interstitial and airspace densities as seen previously. Endotracheal and enteric tubes again noted. The side port of the enteric tube is in the expected location of the gastroesophageal junction or just distal to it. Stable cardiac silhouette. No acute osseous pathology. IMPRESSION: 1. Interval development of a moderate size right-sided pneumothorax. 2. Bilateral interstitial and airspace densities as seen previously. 3. Endotracheal and enteric tubes again noted. These results were called by telephone at the time of interpretation on 12/31/2018 at 10:44 am to provider [facility] , who verbally acknowledged these results. Electronically Signed   By: Elgie Collard M.D.   On: 01/14/2019 11:16   Dg Chest Port 1 View  Result Date: 01/15/2019 CLINICAL DATA:  85 year old male COVID-19. Respiratory failure. EXAM: PORTABLE CHEST 1 VIEW COMPARISON:  01/14/2019 and earlier. FINDINGS: Portable AP semi upright view at 0612 hours. External wire looped around the thoracic inlet. Stable endotracheal tube, tip between the level the clavicles and carina. Stable enteric tube coursing to the abdomen. Stable lung volumes and mediastinal contours. Mildly regressed coarse and confluent interstitial opacity in the mid right lung since yesterday. Coarse and patchy left lung opacity not significantly changed. No superimposed pneumothorax. No pleural effusion is evident. IMPRESSION: 1. Stable lines and tubes. 2. Mildly improved ventilation in the mid right lung since yesterday. 3. Bilateral COVID-19 pneumonia. Electronically Signed   By: Odessa Fleming M.D.   On: 01/15/2019 07:59   Dg Chest Port 1 View  Result Date: 01/14/2019 CLINICAL  DATA:  Respiratory failure EXAM: PORTABLE CHEST 1 VIEW COMPARISON:  CT chest dated 01/12/2019 and chest radiograph dated 01-11-2019. FINDINGS: An endotracheal tube terminates in the upper thoracic trachea. An enteric tube enters the stomach and terminates below the field of view. The heart is mildly enlarged. Moderate diffuse bilateral interstitial and airspace opacities are seen. There is no large pleural effusion. There is no pneumothorax. IMPRESSION: 1. Moderate diffuse bilateral interstitial and airspace opacities. This may represent alveolar edema or multifocal pneumonia. Electronically Signed   By: Romona Curls M.D.   On: 01/14/2019 12:18   Vas [place] Lower Extremity Venous (dvt)  Result Date: 12/25/2018  Lower Venous Study Indications: Covid positive, elevated D-dimer.  Limitations: Patient on rebreather, about to be intubated. Comparison Study: No prior study on file for comparison. Performing Technologist: Sherren Kerns RVS  Examination Guidelines: A complete evaluation includes B-mode imaging, spectral Doppler, color Doppler, and power Doppler as needed of all accessible portions of each vessel. Bilateral testing is considered an integral part of a complete examination. Limited examinations for reoccurring indications may be performed as noted.  +---------+---------------+---------+-----------+----------+--------------+ RIGHT    CompressibilityPhasicitySpontaneityPropertiesThrombus Aging +---------+---------------+---------+-----------+----------+--------------+ CFV      Full           Yes      No                                  +---------+---------------+---------+-----------+----------+--------------+ SFJ      Full                                                        +---------+---------------+---------+-----------+----------+--------------+  FV Prox  Full                                                         +---------+---------------+---------+-----------+----------+--------------+ FV Mid   Full                                                        +---------+---------------+---------+-----------+----------+--------------+ FV DistalFull                                                        +---------+---------------+---------+-----------+----------+--------------+ PFV      Full                                                        +---------+---------------+---------+-----------+----------+--------------+ POP      Full           Yes      No                                  +---------+---------------+---------+-----------+----------+--------------+ PTV      None                                         Acute          +---------+---------------+---------+-----------+----------+--------------+ PERO     Full                                                        +---------+---------------+---------+-----------+----------+--------------+ Gastroc  None                                         Acute          +---------+---------------+---------+-----------+----------+--------------+   +---------+---------------+---------+-----------+----------+--------------+ LEFT     CompressibilityPhasicitySpontaneityPropertiesThrombus Aging +---------+---------------+---------+-----------+----------+--------------+ CFV      Full           No       No                                  +---------+---------------+---------+-----------+----------+--------------+ SFJ      Full                                                        +---------+---------------+---------+-----------+----------+--------------+  FV Prox  Full                                                        +---------+---------------+---------+-----------+----------+--------------+ FV Mid   Full                                                         +---------+---------------+---------+-----------+----------+--------------+ FV DistalFull                                                        +---------+---------------+---------+-----------+----------+--------------+ PFV      Full                                                        +---------+---------------+---------+-----------+----------+--------------+ POP      Full           No       No                                  +---------+---------------+---------+-----------+----------+--------------+ PTV      None                                         Acute          +---------+---------------+---------+-----------+----------+--------------+ PERO     Full                                                        +---------+---------------+---------+-----------+----------+--------------+ Gastroc  None                                         Acute          +---------+---------------+---------+-----------+----------+--------------+     Summary: Right: Findings consistent with acute deep vein thrombosis involving the right posterior tibial veins, and right gastrocnemius veins. No cystic structure found in the popliteal fossa. Left: Findings consistent with acute deep vein thrombosis involving the left posterior tibial veins, and left gastrocnemius veins. No cystic structure found in the popliteal fossa.  *See table(s) above for measurements and observations. Electronically signed by Waverly Ferrarihristopher Dickson MD on Aug 01, 2018 at 8:34:45 AM.    Final     Microbiology Recent Results (from the past 240 hour(s))  MRSA PCR Screening     Status: None   Collection Time: 01/12/19  2:34 PM   Specimen: Nasal Mucosa; Nasopharyngeal  Result Value Ref Range Status  MRSA by PCR NEGATIVE NEGATIVE Final    Comment:        The GeneXpert MRSA Assay (FDA approved for NASAL specimens only), is one component of a comprehensive MRSA colonization surveillance program. It is not intended  to diagnose MRSA infection nor to guide or monitor treatment for MRSA infections. Performed at [facility], [address] [place], [place] [address]     Lab Basic Metabolic Panel: Recent Labs  Lab 01/12/19 0021  01/13/19 0345 01/14/19 0504 01/14/19 1329 01/15/19 0400 01/15/19 0455 01/15/19 1655 01/17/2019 0525  NA 134*   < > 134* 136 139 141 143  --  145  K 4.1   < > 4.5 4.6 4.5 4.9 5.1  --  4.9  CL 100  --  100 103  --   --  109  --  110  CO2 26  --  25 23  --   --  26  --  27  GLUCOSE 145*  --  181* 207*  --   --  207*  --  256*  BUN 31*  --  25* 38*  --   --  64*  --  74*  CREATININE 0.64  --  0.64 0.84  --   --  1.07  --  1.15  CALCIUM 8.4*  --  8.3* 8.5*  --   --  8.3*  --  8.0*  MG  --   --   --   --   --   --  2.5* 2.7* 2.6*  PHOS  --   --   --   --   --   --  4.3 3.9 4.0   < > = values in this interval not displayed.   Liver Function Tests: Recent Labs  Lab 01/11/19 0303 01/12/19 0021 01/13/19 0345 01/14/19 0504 01/15/19 0455  AST 95* 67* 46* 52* 82*  ALT 104* 100* 69* 57* 54*  ALKPHOS 52 74 91 141* 96  BILITOT 0.9 0.9 1.3* 2.2* 1.1  PROT 5.9* 5.9* 5.6* 5.7* 5.3*  ALBUMIN 2.7* 2.5* 2.6* 2.5* 2.3*   No results for input(s): LIPASE, AMYLASE in the last 168 hours. No results for input(s): AMMONIA in the last 168 hours. CBC: Recent Labs  Lab 01/12/19 0021  01/13/19 0345 01/14/19 0504 01/14/19 1329 01/15/19 0400 01/15/19 0455 01/12/2019 0525  WBC 9.1  --  6.6 7.7  --   --  5.9 8.1  NEUTROABS 8.6*  --  6.2 7.2  --   --  5.3 7.5  HGB 13.1   < > 12.7* 13.3 12.2* 12.2* 12.6* 12.8*  HCT 39.5   < > 36.9* 39.5 36.0* 36.0* 38.1* 40.2  MCV 96.6  --  97.4 97.1  --   --  102.1* 104.4*  PLT 107*  --  84* 63*  --   --  46* 40*   < > = values in this interval not displayed.   Cardiac Enzymes: No results for input(s): CKTOTAL, CKMB, CKMBINDEX, TROPONINI in the last 168 hours. Sepsis Labs: Recent Labs  Lab 01/13/19 0345 01/14/19 0504  01/15/19 0455 12/28/2018 0525  WBC 6.6 7.7 5.9 8.1    Procedures/Operations  Intubation, ETT   Max Fickle 01/17/2019, 6:31 PM

## 2019-01-23 NOTE — Progress Notes (Signed)
Patient extubated per withdrawal order. 

## 2019-01-23 NOTE — Progress Notes (Addendum)
OT Cancellation Note  Patient Details Name: Matheo Rathbone MRN: [id number] DOB: May 11, 1930   Cancelled Treatment:    Reason Eval/Treat Not Completed: Medical issues which prohibited therapy; remains sedated, on vent. Will follow.  Lou Cal, OT [facility] Pager [phone] Office [phone]    Raymondo Band 01/15/2019, 2:39 PM

## 2019-01-23 NOTE — Progress Notes (Signed)
PT Cancellation Note  Patient Details Name: Anthony Carey MRN: [id number] DOB: [birth year]   Cancelled Treatment:    Reason Eval/Treat Not Completed: Medical issues which prohibited therapy, remains sedated on vent .   Claretha Cooper 17-Jan-2019, 7:03 AM [place]  Office [phone]

## 2019-01-23 NOTE — Progress Notes (Signed)
NAME:  Anthony Carey, MRN:  [id number], DOB:  1930-09-14, LOS: 7 ADMISSION DATE:  01-18-2019, CONSULTATION DATE:  11/23 REFERRING MD:  Natale Milch, CHIEF COMPLAINT:  Dyspnea   Brief History   85 year old admitted with COVID-19 pneumonia Transfer to ICU on 11/20 for worsening hypoxia and need for high flow nasal Intubated on 11/22   Past Medical History  Hyperlipidemia, hypothyroidism  Significant Hospital Events   11/17-admit 11/20-transfer to ICU 111/22- Fatigued and paradoxical breathing > Intubated 11/24 R PNX  Consults:  PCCM  Procedures:  ETT 11/22 >>  Significant Diagnostic Tests:  11/22 acute bilat DVT 11/24 CXR moderate R PNX  Micro Data:  COVID 11/17? positive  Antimicrobials:  Actemra 11/20 Remdesivir 11/17 > 11/21 Decadron 11/17 >   Interim history/subjective:  Increasing FiO2and sedation requirements, increased WOB.  R PNX on CXR   Objective   Blood pressure (!) 100/57, pulse (!) 101, temperature 99.3 F (37.4 C), temperature source Oral, resp. rate (!) 34, height 5\' 7"  (1.702 m), weight 81.8 kg, SpO2 92 %.    Vent Mode: PRVC FiO2 (%):  [40 %-80 %] 60 % Set Rate:  [30 bmp] 30 bmp Vt Set:  [520 mL] 520 mL PEEP:  [5 cmH20-8 cmH20] 5 cmH20 Plateau Pressure:  [18 cmH20-21 cmH20] 18 cmH20   Intake/Output Summary (Last 24 hours) at 01/02/2019 0827 Last data filed at 12/28/2018 0700 Gross per 24 hour  Intake 2035.95 ml  Output 1440 ml  Net 595.95 ml   Filed Weights   01-18-19 2002 01/10/19 0320 12/31/2018 0425  Weight: 81.6 kg 81.6 kg 81.8 kg    Examination:  General: Thin, elderly, acutely ill-appearing HENT: Normocephalic, PERRL. Moist mucus membranes Neck: No JVD. Trachea midline. N CV: RRR. S1S2. No MRG. +1 DP/PT pulses.  +2 radial pulses Lungs: BBS are absent on right, diminished on left, tachypneic, paradoxical effort, vent supported ABD: Rounded, hyperactive BS x4. SNT/ND. No masses, guarding or rigidity GU: Foley EXT: No edema  Skin: Pale, WD. In tact. No rashes or lesions Neuro: Withdraws x4     Resolved Hospital Problem list     Assessment & Plan:  ARDS secondary to COVID-19 with moderate size right pneumothorax-discussed with patient's wife by phone.  In the setting of worsening hypoxia now with acute pneumothorax and advanced age with COVID-19 patient's wife has discussed with her son and they do not wish to pursue chest tube or any further testing or treatments.   They would like to transition to comfort care only. They will assemble and notify staff when they can come to the hospital to proceed.   In the meantime will continue supportive care by the following: Mechanical ventilation per ARDS protocol Target TVol 6-8cc/kgIBW Target Plateau Pressure < 30cm H20 Target driving pressure less than 15 cm of water Target PaO2 55-65: titrate PEEP/FiO2 per protocol Ventilator associated pneumonia prevention protocol  Thrombocytopenia, likely due to COVID-worse today Monitor for bleeding Discontinue enoxaparin  Slightly worse renal function No further monitoring  DVT, new acute Discontinue enoxaparin   Need for sedation for mechanical ventilation: RASS target -2 Fentanyl infusion, versed prn PAD protocol  When family arrives will transition to comfort   Best practice:  Diet: Discontinue tube feeding Pain/Anxiety/Delirium protocol (if indicated): as above VAP protocol (if indicated): yes DVT prophylaxis: Discontinue GI prophylaxis: Continue Glucose control: Discontinue Mobility: bed rest Code Status: DNR Family Communication: Updated as noted above.  Labs   CBC: Recent Labs  Lab 01/12/19 0021  01/13/19 0345 01/14/19 01/12/2019  01/14/19 1329 01/15/19 0400 01/15/19 0455 2019/02/02 0525  WBC 9.1  --  6.6 7.7  --   --  5.9 8.1  NEUTROABS 8.6*  --  6.2 7.2  --   --  5.3 7.5  HGB 13.1   < > 12.7* 13.3 12.2* 12.2* 12.6* 12.8*  HCT 39.5   < > 36.9* 39.5 36.0* 36.0* 38.1* 40.2  MCV 96.6  --  97.4  97.1  --   --  102.1* 104.4*  PLT 107*  --  84* 63*  --   --  46* 40*   < > = values in this interval not displayed.    Basic Metabolic Panel: Recent Labs  Lab 01/12/19 0021  01/13/19 0345 01/14/19 0504 01/14/19 1329 01/15/19 0400 01/15/19 0455 01/15/19 1655 02-Feb-2019 0525  NA 134*   < > 134* 136 139 141 143  --  145  K 4.1   < > 4.5 4.6 4.5 4.9 5.1  --  4.9  CL 100  --  100 103  --   --  109  --  110  CO2 26  --  25 23  --   --  26  --  27  GLUCOSE 145*  --  181* 207*  --   --  207*  --  256*  BUN 31*  --  25* 38*  --   --  64*  --  74*  CREATININE 0.64  --  0.64 0.84  --   --  1.07  --  1.15  CALCIUM 8.4*  --  8.3* 8.5*  --   --  8.3*  --  8.0*  MG  --   --   --   --   --   --  2.5* 2.7* 2.6*  PHOS  --   --   --   --   --   --  4.3 3.9 4.0   < > = values in this interval not displayed.   GFR: Estimated Creatinine Clearance: 47.2 mL/min (by C-G formula based on SCr of 1.15 mg/dL). Recent Labs  Lab 01/13/19 0345 01/14/19 0504 01/15/19 0455 02/02/19 0525  WBC 6.6 7.7 5.9 8.1    Liver Function Tests: Recent Labs  Lab 01/11/19 0303 01/12/19 0021 01/13/19 0345 01/14/19 0504 01/15/19 0455  AST 95* 67* 46* 52* 82*  ALT 104* 100* 69* 57* 54*  ALKPHOS 52 74 91 141* 96  BILITOT 0.9 0.9 1.3* 2.2* 1.1  PROT 5.9* 5.9* 5.6* 5.7* 5.3*  ALBUMIN 2.7* 2.5* 2.6* 2.5* 2.3*   No results for input(s): LIPASE, AMYLASE in the last 168 hours. No results for input(s): AMMONIA in the last 168 hours.  ABG    Component Value Date/Time   PHART 7.381 01/15/2019 0400   PCO2ART 46.0 01/15/2019 0400   PO2ART 88.0 01/15/2019 0400   HCO3 27.3 01/15/2019 0400   TCO2 29 01/15/2019 0400   O2SAT 96.0 01/15/2019 0400      CBG: Recent Labs  Lab 01/15/19 1306 01/15/19 1644 01/15/19 2003 01/15/19 2324 2019-02-02 0404  GLUCAP 154* 179* 195* 207* 217*      The patient is critically ill with ARDS/respiratory failure due to COVID-19. He requires ICU for high complexity decision making,  titration of high alert medications, ventilator management, titration of oxygen and interpretation of advanced monitoring.    I personally spent 60 minutes providing critical care services including personally reviewing test results, discussing care with nursing staff/other physicians and completing orders pertaining to this  patient.  Time was exclusive to the patient and does not include time spent teaching or in procedures.  Voice recognition software was used in the production of this record.  Errors in interpretation may have been inadvertently missed during review.  Karin Lieuhonda Anyelina Claycomb, MSN, AGACNP  [facility]

## 2019-01-23 NOTE — Progress Notes (Signed)
wasted 175 ml fentanyl with jason post mortem.

## 2019-01-23 NOTE — Plan of Care (Addendum)
Fentanyl titrated as per RAAS goal. No further isode of desaturation this shift. DP and PT pulses with dppler ultrasound. Right foot colder than left.  Problem: Respiratory: Goal: Will maintain a patent airway Outcome: Progressing Goal: Complications related to the disease process, condition or treatment will be avoided or minimized Outcome: Progressing   Problem: Clinical Measurements: Goal: Ability to maintain clinical measurements within normal limits will improve Outcome: Progressing Goal: Will remain free from infection Outcome: Progressing Goal: Diagnostic test results will improve Outcome: Progressing Goal: Respiratory complications will improve Outcome: Progressing Goal: Cardiovascular complication will be avoided Outcome: Progressing   Problem: Activity: Goal: Risk for activity intolerance will decrease Outcome: Progressing   Problem: Nutrition: Goal: Adequate nutrition will be maintained Outcome: Progressing

## 2019-01-23 NOTE — Progress Notes (Signed)
Inpatient Diabetes Program Recommendations  AACE/ADA: New Consensus Statement on Inpatient Glycemic Control  Target Ranges:  Prepandial:   less than 140 mg/dL      Peak postprandial:   less than 180 mg/dL (1-2 hours)      Critically ill patients:  140 - 180 mg/dL   Results for QUENTAVIOUS, RITTENHOUSE (MRN [id number]) as of 02-07-2019 11:06  Ref. Range 01/15/2019 00:21 01/15/2019 10:41 01/15/2019 13:06 01/15/2019 16:44 01/15/2019 20:03 01/15/2019 23:24 02-07-19 04:04 02-07-2019 08:52  Glucose-Capillary Latest Ref Range: 70 - 99 mg/dL 157 (H) 159 (H) 154 (H) 179 (H) 195 (H) 207 (H) 217 (H) 247 (H)   Review of Glycemic Control  Current orders for Inpatient glycemic control: Novolog 0-20 units Q4H; Vital @ 70 ml/hr, Decadron 6 mg Q24H  Inpatient Diabetes Program Recommendations:   Insulin-Tube Feeding: Please consider ordering Novolog 5 units Q4H for tube feeding coverage. If tube feeding is stopped or held then Novolog tube feeding coverage should also be stopped or held.  Thanks, Barnie Alderman, RN, MSN, CDE Diabetes Coordinator Inpatient Diabetes Program [phone] (Team Pager from 8am to 5pm)

## 2019-01-23 DEATH — deceased

## 2020-09-30 IMAGING — CT CT ANGIO CHEST
1 of 3 series · 14 of 31 positions shown · IV contrast (omnipaque)
Comparison: Thyroid ultrasound, 12/28/2013

CLINICAL DATA: Chest pain, weakness and fatigue

EXAM:
CT ANGIOGRAPHY CHEST WITH CONTRAST
TECHNIQUE: Multidetector CT imaging of the chest was performed using the
standard protocol during bolus administration of intravenous
contrast. Multiplanar CT image reconstructions and MIPs were
obtained to evaluate the vascular anatomy.
CONTRAST:  75mL OMNIPAQUE IOHEXOL 350 MG/ML SOLN

[Series 4: (person_name) thins · axial · 0.80mm/px · z∈[+166,+434]mm · 14 of 160 slices shown]
[im 13/160  lung]
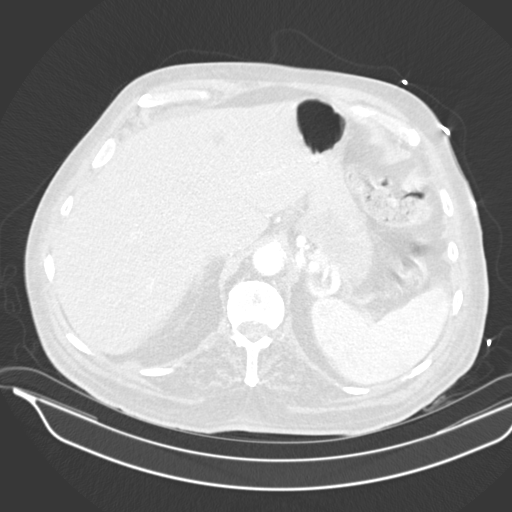
[im 25/160  mediastinal]
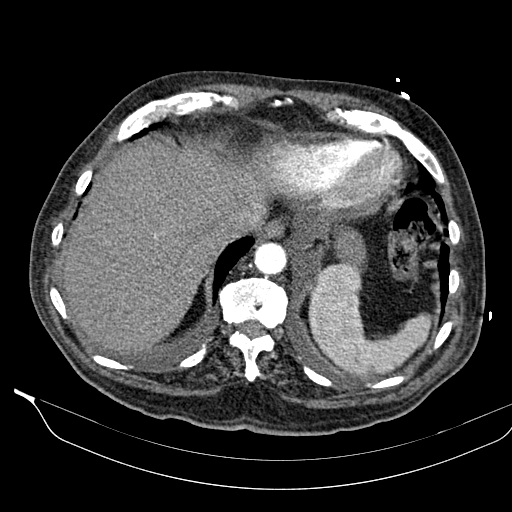
[im 37/160  lung]
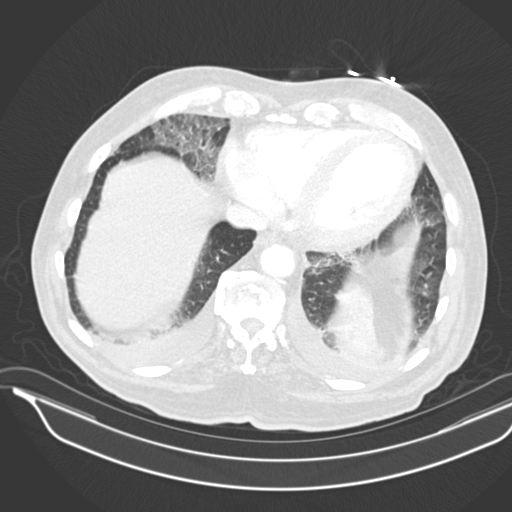
[im 49/160  mediastinal]
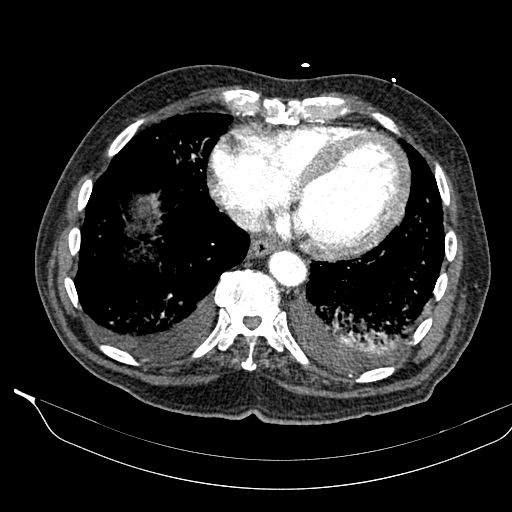
[im 62/160  lung]
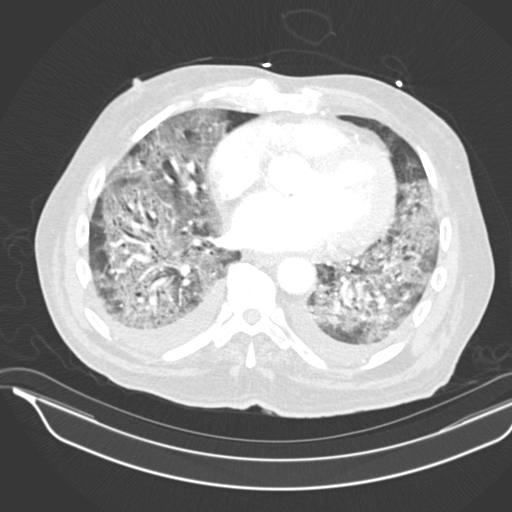
[im 74/160  mediastinal]
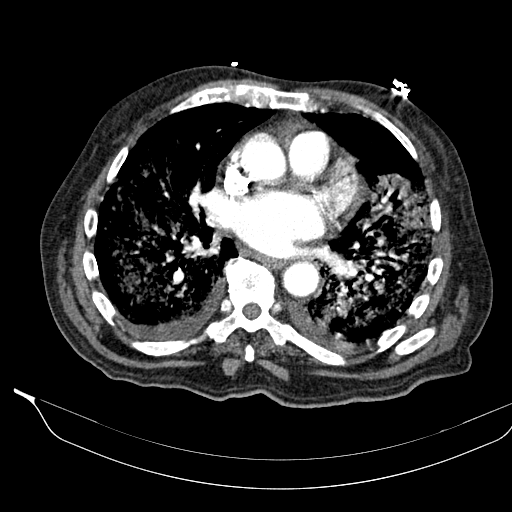
[im 76/160  lung]
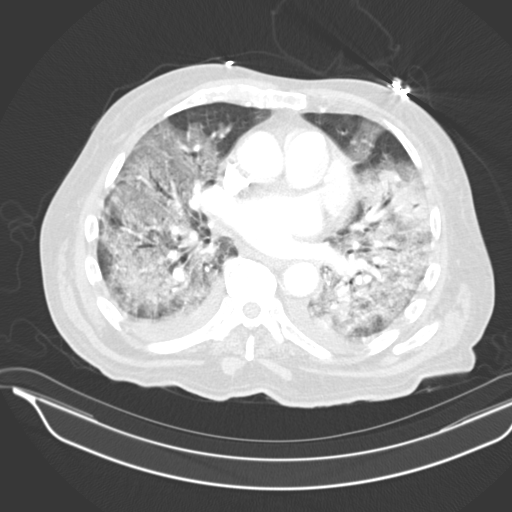
[im 80/160  mediastinal]
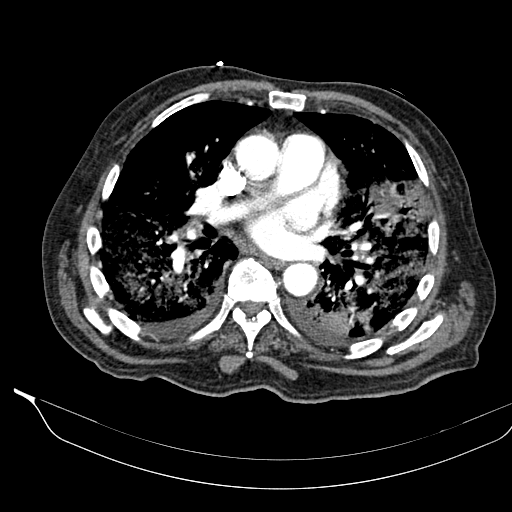
[im 86/160  lung]
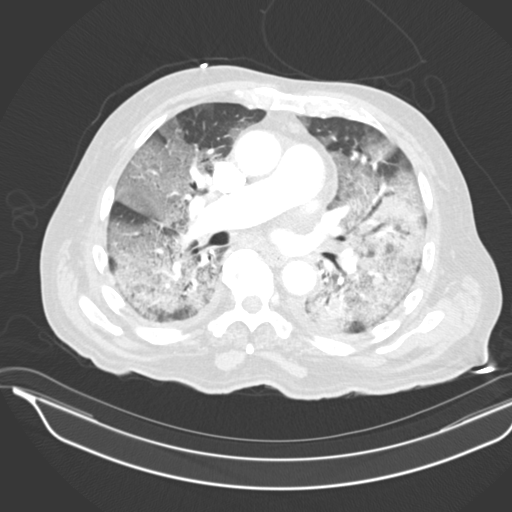
[im 98/160  mediastinal]
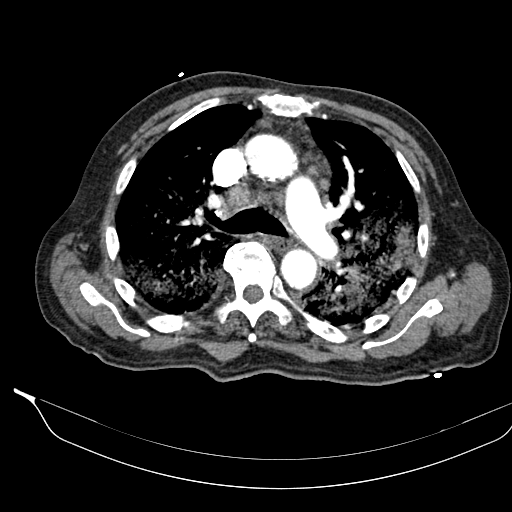
[im 111/160  lung]
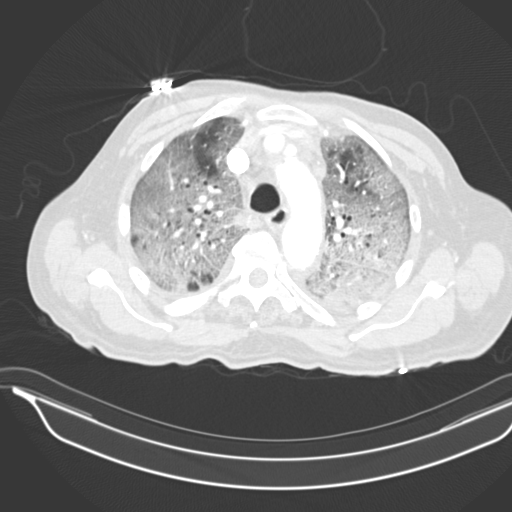
[im 123/160  mediastinal]
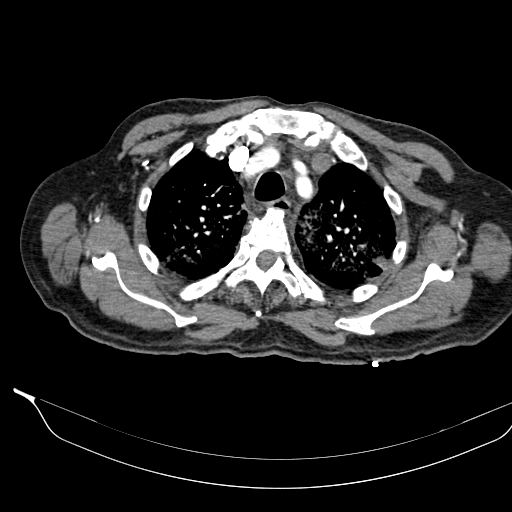
[im 135/160  lung]
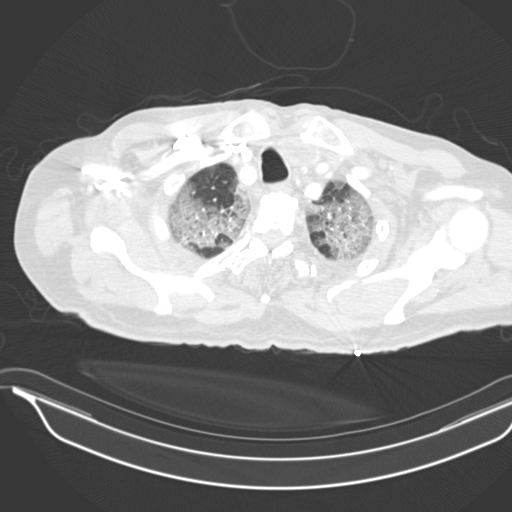
[im 147/160  mediastinal]
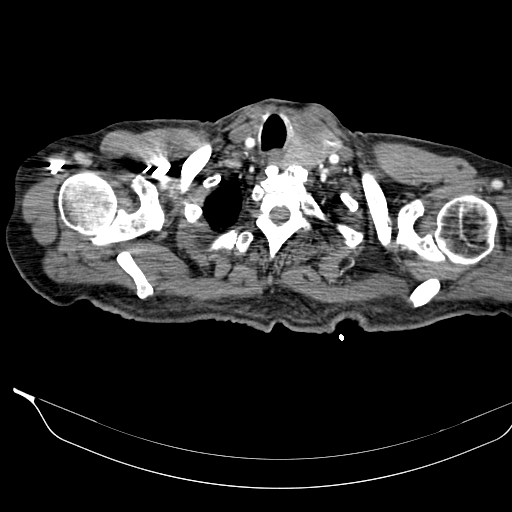

[14 of 31 positions shown; findings below may reference images not displayed]

FINDINGS: Cardiovascular: Satisfactory opacification of the pulmonary arteries
to the segmental level. No evidence of pulmonary embolism. Mild
cardiomegaly. Three-vessel coronary artery calcifications. No
pericardial effusion. Aortic valve calcifications. Aortic
atherosclerosis.

Mediastinum/Nodes: No enlarged mediastinal, hilar, or axillary lymph
nodes. Status post right thyroidectomy. Large, hypodense nodules of
the left lobe of the thyroid. Trachea, and esophagus demonstrate no
significant findings.

Lungs/Pleura: There is very dense, confluent and consolidative
ground-glass opacity throughout the lungs bilaterally, with some
sparing of the lung bases and subpleural lungs throughout. There may
be a discrete nodule versus small consolidation of the medial
segment right middle lobe measuring 9 mm (series 5, image 108).
Small bilateral pleural effusions.

Upper Abdomen: No acute abnormality.

Musculoskeletal: No chest wall abnormality. Disc degenerative
disease and ankylosis of the thoracic spine.

Review of the MIP images confirms the above findings.
IMPRESSION: 1.  Negative examination for pulmonary embolism.

2. There is very dense, confluent and consolidative ground-glass
opacity throughout the lungs bilaterally, with some sparing of the
lung bases and subpleural lungs throughout. Findings are most
consistent with multifocal infection, edema, and/or ARDS.

3. There may be a discrete nodule versus small consolidation of the
medial segment right middle lobe measuring 9 mm (series 5, image
108). Recommend follow-up CT in 3-6 months if clinically
appropriate.

4.  Small bilateral pleural effusions.

5.  Coronary artery disease.

6. Aortic valve calcifications. Correlate for echocardiographic
evidence of aortic valve stenosis.

7.  Aortic Atherosclerosis ([id number]).

8. Multiple left thyroid nodules status post right thyroidectomy,
consistent with goiter better assessed by prior ultrasound.

## 2020-10-03 IMAGING — DX DG CHEST 1V PORT
2 series · 2 of 2 positions shown · non-contrast
Comparison: 01/14/2019 and earlier.

CLINICAL DATA: 88-year-old male [id number]. Respiratory failure.

EXAM:
PORTABLE CHEST 1 VIEW

[chest ap (1 of 2)]
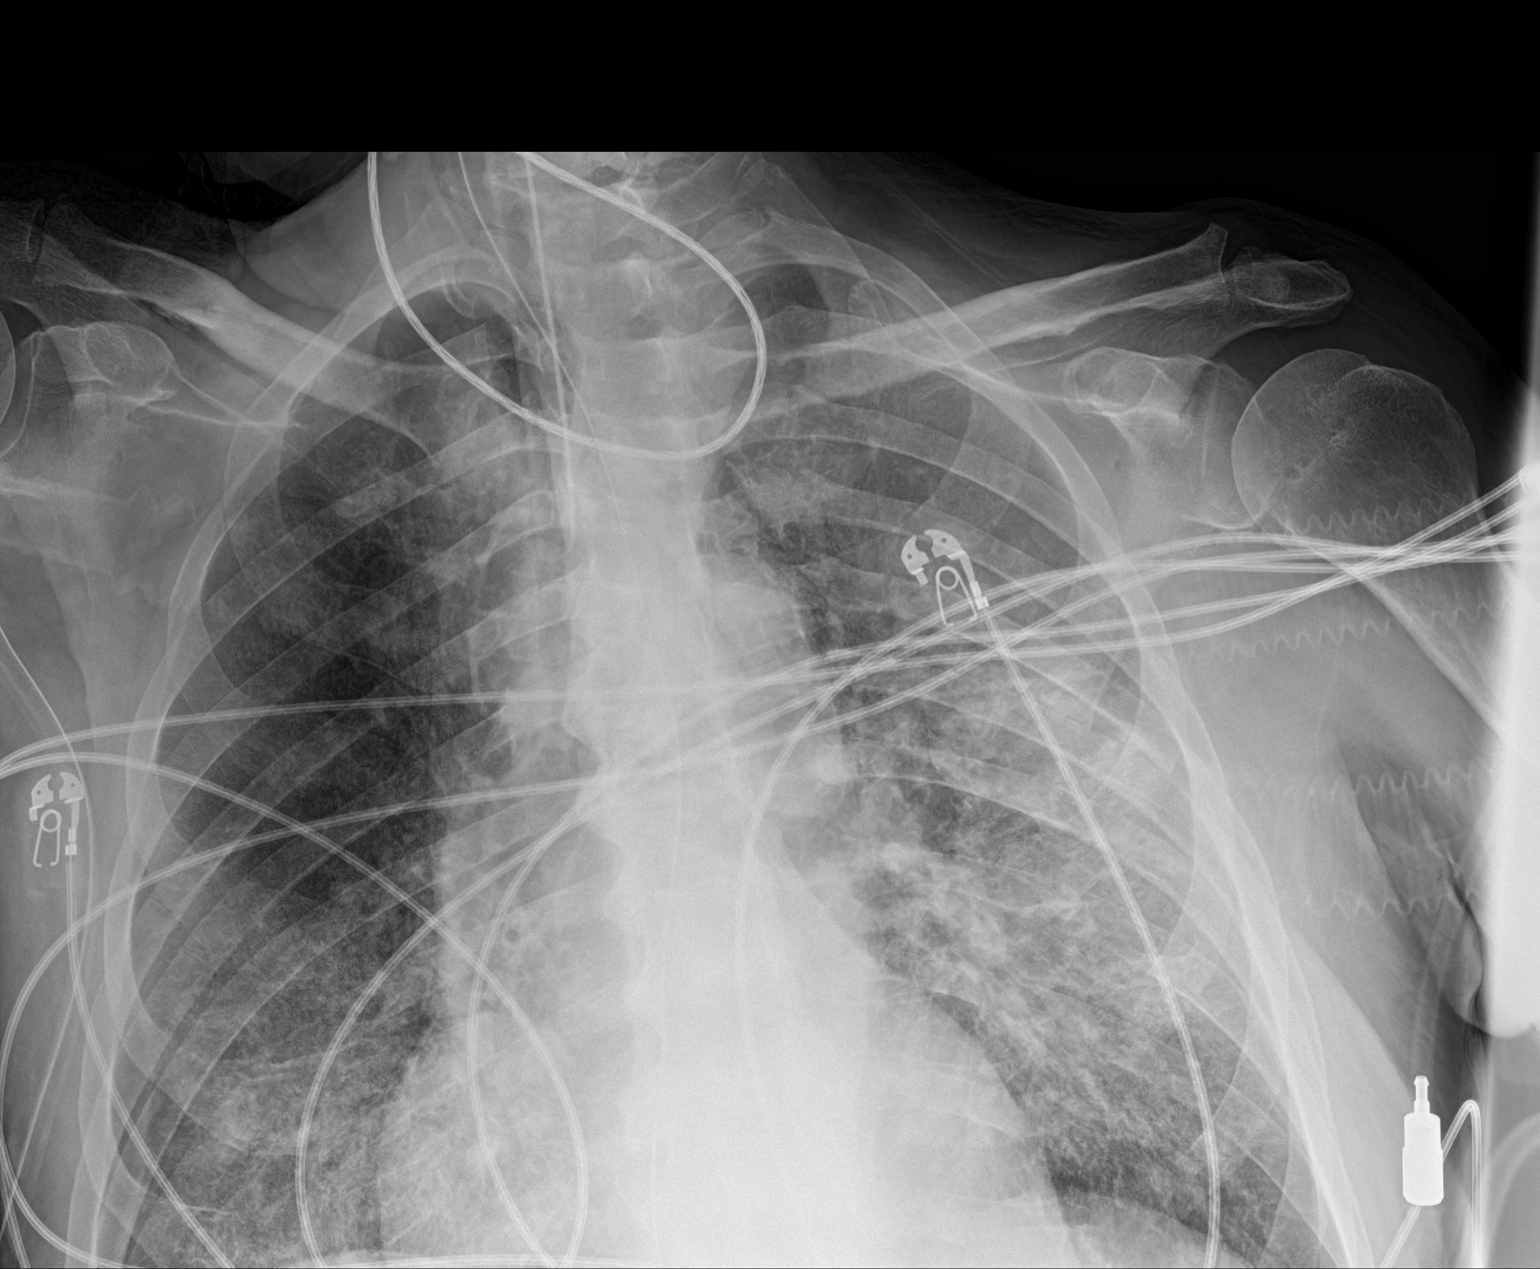

[chest ap (2 of 2)]
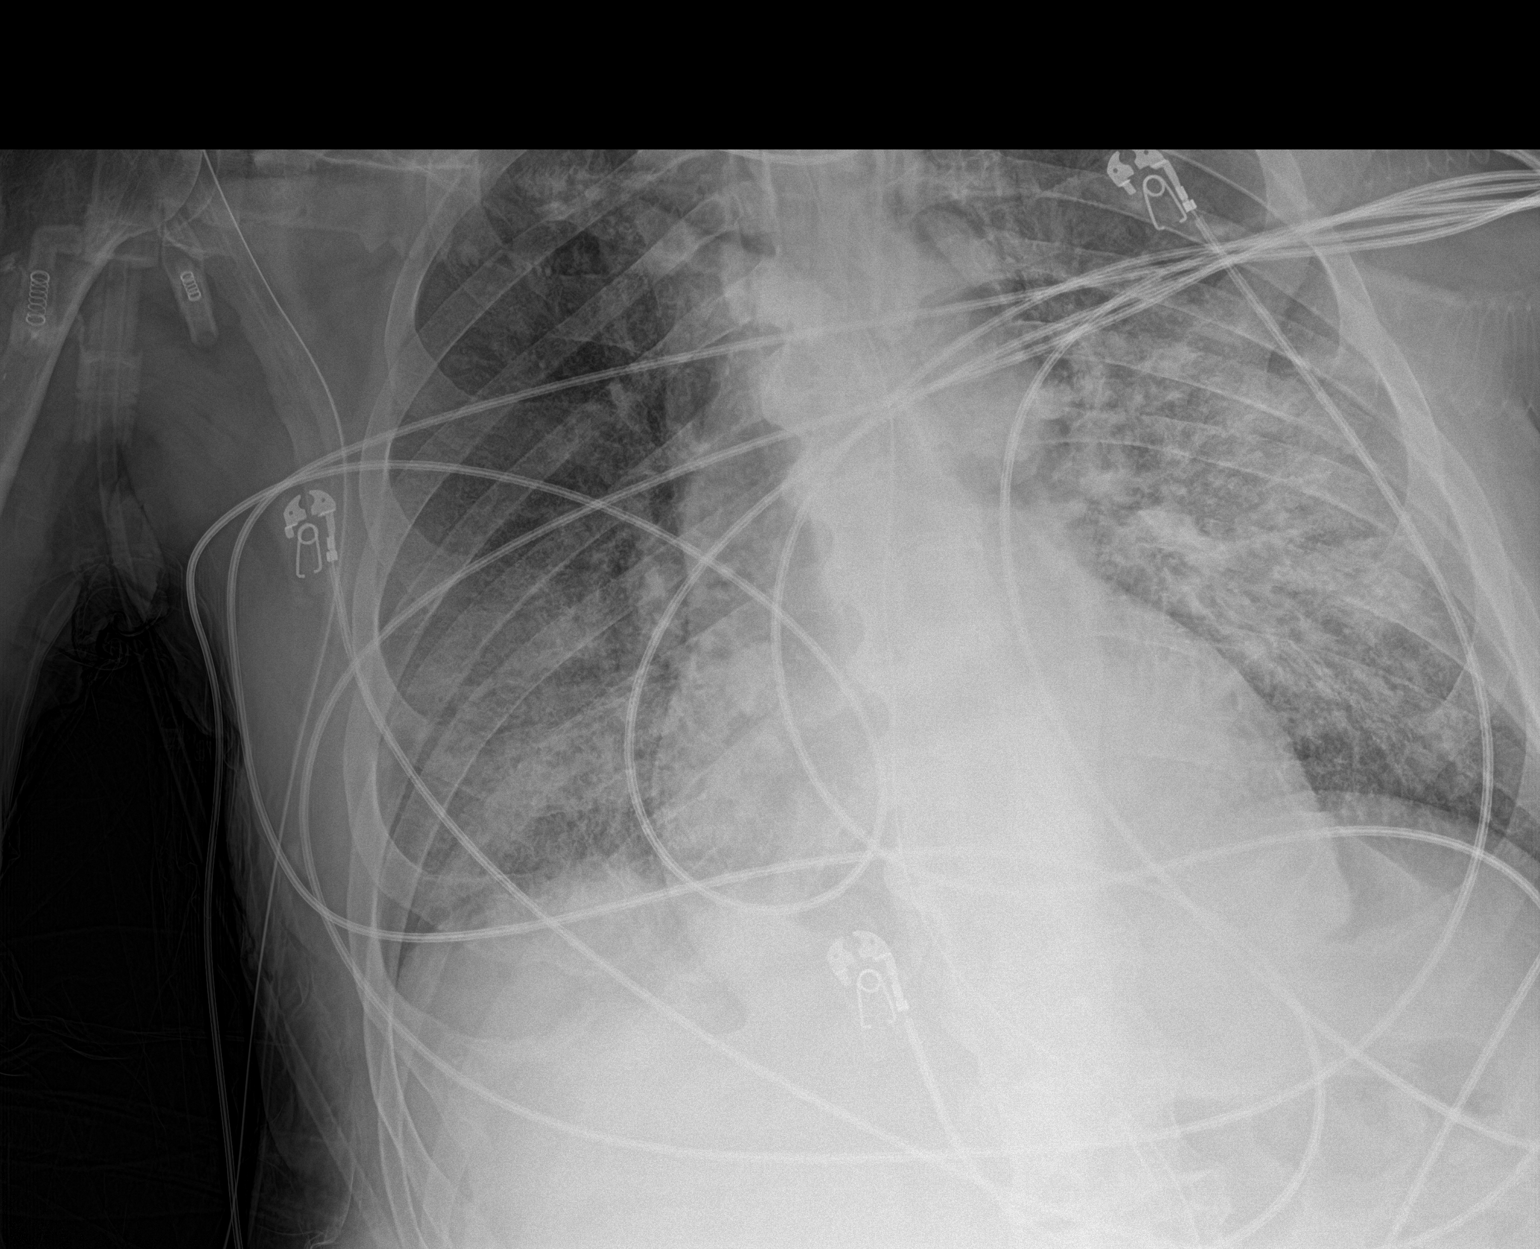

[2 of 2 positions shown; findings below may reference images not displayed]

FINDINGS: Portable AP semi upright view at 1871 hours. External wire looped
around the thoracic inlet. Stable endotracheal tube, tip between the
level the clavicles and carina. Stable enteric tube coursing to the
abdomen.

Stable lung volumes and mediastinal contours. Mildly regressed
coarse and confluent interstitial opacity in the mid right lung
since yesterday. Coarse and patchy left lung opacity not
significantly changed. No superimposed pneumothorax. No pleural
effusion is evident.
IMPRESSION: 1. Stable lines and tubes.
2. Mildly improved ventilation in the mid right lung since
yesterday.
3. Bilateral [id number] pneumonia.

## 2020-10-04 IMAGING — DX DG CHEST 1V PORT
1 series · 1 of 1 positions shown · non-contrast
Comparison: Chest radiograph dated 01/15/2019.

CLINICAL DATA: 88-year-old male with hypoxia.

EXAM:
PORTABLE CHEST 1 VIEW

[chest ap]
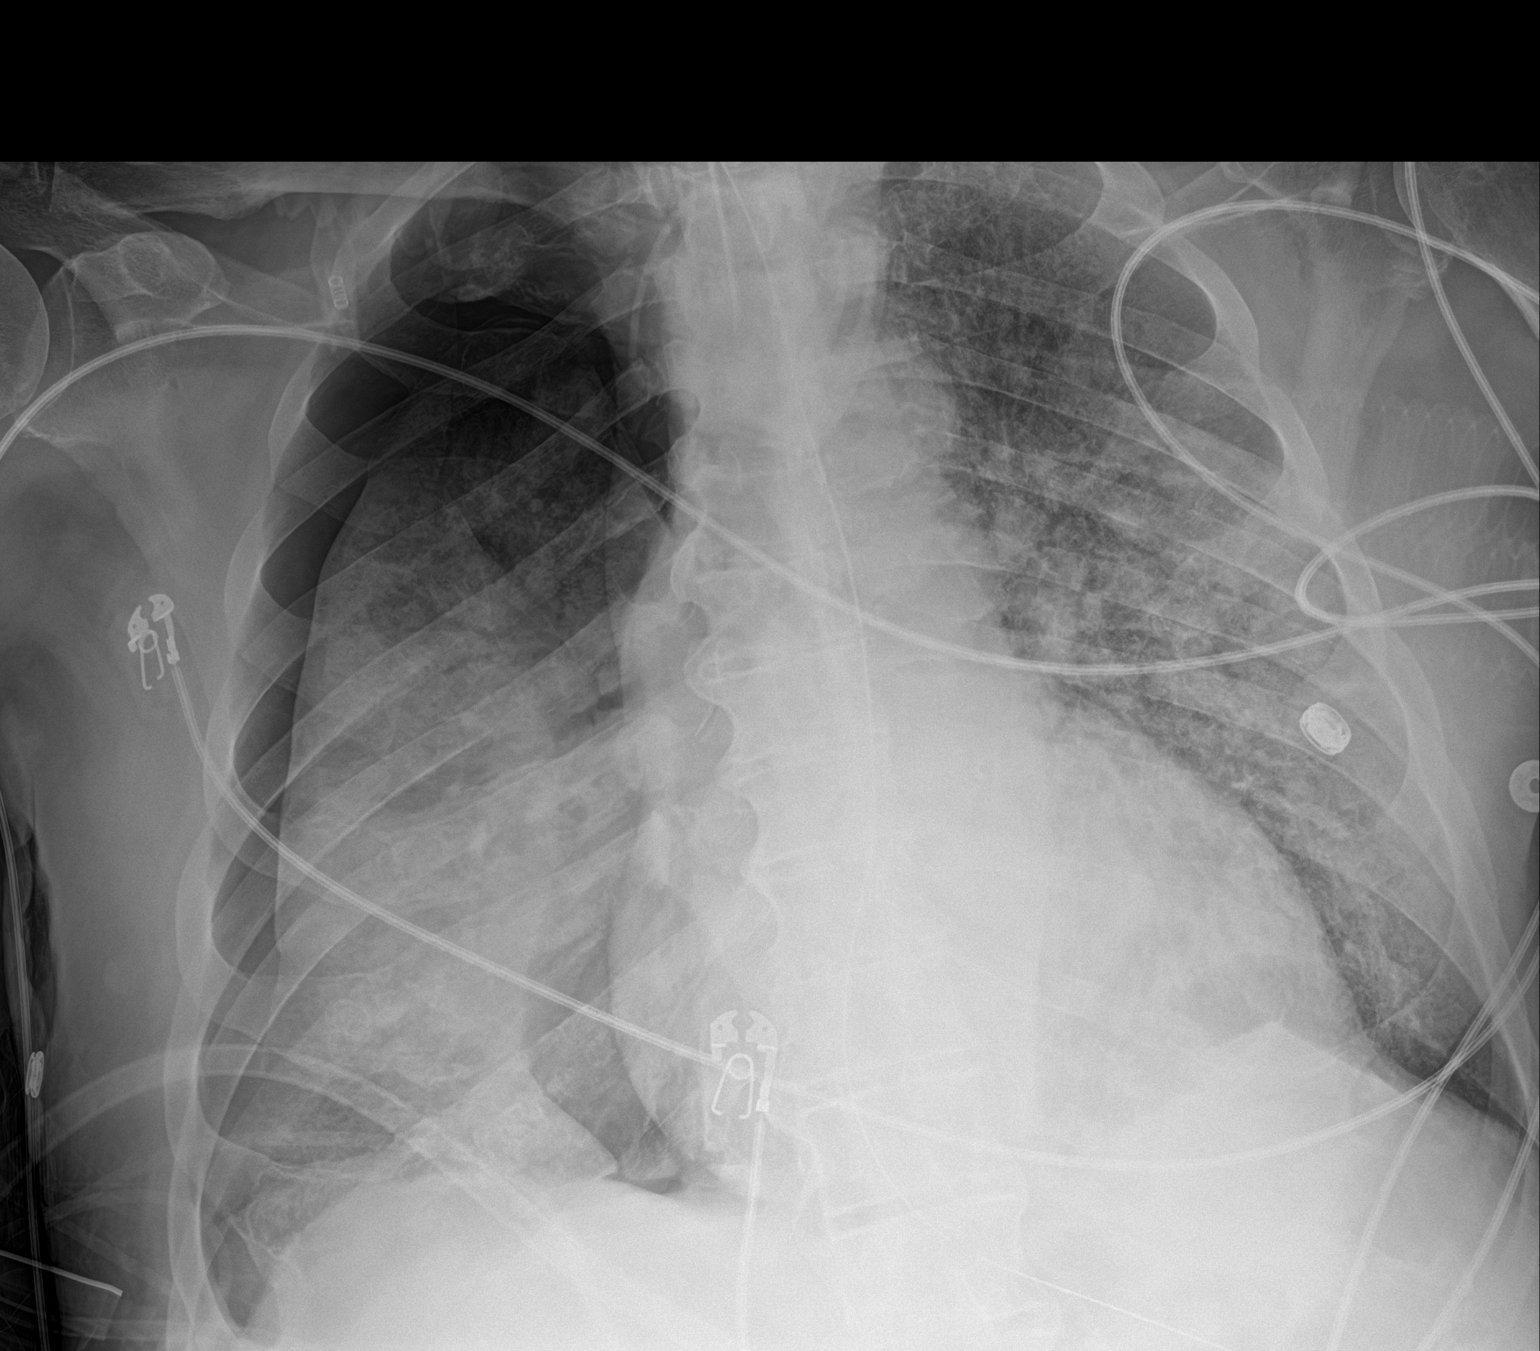

[1 of 1 positions shown; findings below may reference images not displayed]

FINDINGS: Interval development of a right-sided pneumothorax measuring
approximately 6 cm to the right apex and 2 cm to the lateral pleural
surface. Diffuse bilateral interstitial and airspace densities as
seen previously. Endotracheal and enteric tubes again noted. The
side port of the enteric tube is in the expected location of the
gastroesophageal junction or just distal to it. Stable cardiac
silhouette. No acute osseous pathology.
IMPRESSION: 1. Interval development of a moderate size right-sided pneumothorax.
2. Bilateral interstitial and airspace densities as seen previously.
3. Endotracheal and enteric tubes again noted.

These results were called by telephone at the time of interpretation
on 01/16/2019 at [DATE] to provider KALIO MELINA , who verbally
acknowledged these results.
